# Patient Record
Sex: Female | Born: 1985
Health system: Southern US, Community
[De-identification: ages and names within clinical notes are randomized; demographics above are authoritative.]

## PROBLEM LIST (undated history)

## (undated) ENCOUNTER — Emergency Department (HOSPITAL_COMMUNITY): Payer: Commercial Managed Care - PPO

## (undated) DIAGNOSIS — O139 Gestational [pregnancy-induced] hypertension without significant proteinuria, unspecified trimester: Secondary | ICD-10-CM

## (undated) DIAGNOSIS — Z789 Other specified health status: Secondary | ICD-10-CM

## (undated) DIAGNOSIS — O24419 Gestational diabetes mellitus in pregnancy, unspecified control: Secondary | ICD-10-CM

## (undated) HISTORY — PX: WISDOM TOOTH EXTRACTION: SHX21

## (undated) HISTORY — PX: NO PAST SURGERIES: SHX2092

---

## 2005-02-18 ENCOUNTER — Other Ambulatory Visit: Admission: RE | Admit: 2005-02-18 | Discharge: 2005-02-18 | Payer: Self-pay | Admitting: Obstetrics & Gynecology

## 2011-12-30 ENCOUNTER — Other Ambulatory Visit (HOSPITAL_COMMUNITY): Payer: Self-pay | Admitting: Obstetrics & Gynecology

## 2011-12-30 DIAGNOSIS — N979 Female infertility, unspecified: Secondary | ICD-10-CM

## 2012-01-05 ENCOUNTER — Ambulatory Visit (HOSPITAL_COMMUNITY): Payer: 59

## 2012-11-22 ENCOUNTER — Other Ambulatory Visit (HOSPITAL_COMMUNITY): Payer: Self-pay | Admitting: Obstetrics & Gynecology

## 2012-11-22 DIAGNOSIS — N979 Female infertility, unspecified: Secondary | ICD-10-CM

## 2012-11-26 ENCOUNTER — Ambulatory Visit (HOSPITAL_COMMUNITY)
Admission: RE | Admit: 2012-11-26 | Discharge: 2012-11-26 | Disposition: A | Discharge status: Home / Self Care | Payer: 59 | Source: Ambulatory Visit | Attending: Obstetrics & Gynecology | Admitting: Obstetrics & Gynecology

## 2012-11-26 DIAGNOSIS — N979 Female infertility, unspecified: Secondary | ICD-10-CM | POA: Insufficient documentation

## 2012-11-26 MED ORDER — IOHEXOL 300 MG/ML  SOLN
20.0000 mL | Freq: Once | INTRAMUSCULAR | Status: AC | PRN
  Administered 2012-11-26: 20 mL

## 2015-08-23 ENCOUNTER — Telehealth: Payer: 59 | Admitting: Physician Assistant

## 2015-08-23 DIAGNOSIS — B349 Viral infection, unspecified: Secondary | ICD-10-CM | POA: Diagnosis not present

## 2015-08-23 DIAGNOSIS — J329 Chronic sinusitis, unspecified: Secondary | ICD-10-CM | POA: Diagnosis not present

## 2015-08-23 DIAGNOSIS — B9789 Other viral agents as the cause of diseases classified elsewhere: Secondary | ICD-10-CM

## 2015-08-23 MED ORDER — AZELASTINE HCL 0.1 % NA SOLN: 2.0000 | Freq: Two times a day (BID) | NASAL | Status: DC

## 2015-08-23 NOTE — Progress Notes (Signed)

## 2015-08-27 ENCOUNTER — Telehealth: Payer: 59 | Admitting: Nurse Practitioner

## 2015-08-27 DIAGNOSIS — J0100 Acute maxillary sinusitis, unspecified: Secondary | ICD-10-CM | POA: Diagnosis not present

## 2015-08-27 MED ORDER — AZITHROMYCIN 250 MG PO TABS: ORAL_TABLET | ORAL | Status: DC

## 2015-08-27 NOTE — Progress Notes (Signed)

## 2015-10-04 DIAGNOSIS — H5213 Myopia, bilateral: Secondary | ICD-10-CM | POA: Diagnosis not present

## 2015-10-13 ENCOUNTER — Telehealth: Payer: 59 | Admitting: Physician Assistant

## 2015-10-13 DIAGNOSIS — R399 Unspecified symptoms and signs involving the genitourinary system: Secondary | ICD-10-CM

## 2015-10-13 MED ORDER — NITROFURANTOIN MONOHYD MACRO 100 MG PO CAPS: 100.0000 mg | ORAL_CAPSULE | Freq: Two times a day (BID) | ORAL | Status: DC

## 2015-10-13 NOTE — Progress Notes (Signed)

## 2015-10-14 MED ORDER — NITROFURANTOIN MONOHYD MACRO 100 MG PO CAPS: 100.0000 mg | ORAL_CAPSULE | Freq: Two times a day (BID) | ORAL | Status: DC

## 2015-10-14 NOTE — Addendum Note (Signed)
Addended by: Evelina Dun A on: 10/14/2015 12:40 PM   Modules accepted: Orders

## 2015-11-26 ENCOUNTER — Other Ambulatory Visit (HOSPITAL_COMMUNITY)
Admission: RE | Admit: 2015-11-26 | Discharge: 2015-11-26 | Disposition: A | Discharge status: Home / Self Care | Payer: 59 | Source: Ambulatory Visit | Attending: Obstetrics & Gynecology | Admitting: Obstetrics & Gynecology

## 2015-11-26 DIAGNOSIS — Z124 Encounter for screening for malignant neoplasm of cervix: Secondary | ICD-10-CM | POA: Diagnosis not present

## 2015-11-26 DIAGNOSIS — O26849 Uterine size-date discrepancy, unspecified trimester: Secondary | ICD-10-CM | POA: Diagnosis not present

## 2015-11-26 DIAGNOSIS — Z3A00 Weeks of gestation of pregnancy not specified: Secondary | ICD-10-CM | POA: Insufficient documentation

## 2015-11-26 DIAGNOSIS — N925 Other specified irregular menstruation: Secondary | ICD-10-CM | POA: Diagnosis not present

## 2015-11-26 DIAGNOSIS — Z3201 Encounter for pregnancy test, result positive: Secondary | ICD-10-CM | POA: Diagnosis not present

## 2015-11-26 LAB — HCG, QUANTITATIVE, PREGNANCY: hCG, Beta Chain, Quant, S: 5249 m[IU]/mL — ABNORMAL HIGH (ref <5)

## 2015-11-28 ENCOUNTER — Other Ambulatory Visit (HOSPITAL_COMMUNITY)
Admission: RE | Admit: 2015-11-28 | Discharge: 2015-11-28 | Disposition: A | Discharge status: Home / Self Care | Payer: 59 | Source: Ambulatory Visit | Attending: Obstetrics and Gynecology | Admitting: Obstetrics and Gynecology

## 2015-11-28 DIAGNOSIS — N925 Other specified irregular menstruation: Secondary | ICD-10-CM | POA: Insufficient documentation

## 2015-11-28 LAB — HCG, QUANTITATIVE, PREGNANCY: HCG, BETA CHAIN, QUANT, S: 5299 m[IU]/mL — AB (ref <5)

## 2015-12-03 DIAGNOSIS — O039 Complete or unspecified spontaneous abortion without complication: Secondary | ICD-10-CM | POA: Diagnosis not present

## 2016-05-07 DIAGNOSIS — N925 Other specified irregular menstruation: Secondary | ICD-10-CM | POA: Diagnosis not present

## 2016-05-09 DIAGNOSIS — N925 Other specified irregular menstruation: Secondary | ICD-10-CM | POA: Diagnosis not present

## 2016-05-15 DIAGNOSIS — O200 Threatened abortion: Secondary | ICD-10-CM | POA: Diagnosis not present

## 2016-06-19 DIAGNOSIS — N96 Recurrent pregnancy loss: Secondary | ICD-10-CM | POA: Diagnosis not present

## 2016-06-20 DIAGNOSIS — N96 Recurrent pregnancy loss: Secondary | ICD-10-CM | POA: Diagnosis not present

## 2016-07-05 DIAGNOSIS — N96 Recurrent pregnancy loss: Secondary | ICD-10-CM | POA: Diagnosis not present

## 2016-07-07 DIAGNOSIS — Z319 Encounter for procreative management, unspecified: Secondary | ICD-10-CM | POA: Diagnosis not present

## 2016-08-07 DIAGNOSIS — Z319 Encounter for procreative management, unspecified: Secondary | ICD-10-CM | POA: Diagnosis not present

## 2016-08-11 MED FILL — PROGESTERONE 200 MG CAPSULE: 200 | 30 days supply | Qty: 60 | Fill #0

## 2016-08-27 MED FILL — LETROZOLE 2.5 MG TABLET: 2.5 | 30 days supply | Qty: 10 | Fill #0

## 2016-09-24 MED FILL — LETROZOLE 2.5 MG TABLET: 2.5 | 30 days supply | Qty: 10 | Fill #1

## 2016-09-24 MED FILL — PROGESTERONE 200 MG CAPSULE: 200 | 30 days supply | Qty: 60 | Fill #1

## 2016-11-13 DIAGNOSIS — H5213 Myopia, bilateral: Secondary | ICD-10-CM | POA: Diagnosis not present

## 2016-11-21 MED FILL — LETROZOLE 2.5 MG TABLET: 2.5 | 5 days supply | Qty: 15 | Fill #0

## 2016-12-03 DIAGNOSIS — Z319 Encounter for procreative management, unspecified: Secondary | ICD-10-CM | POA: Diagnosis not present

## 2016-12-05 DIAGNOSIS — Z3189 Encounter for other procreative management: Secondary | ICD-10-CM | POA: Diagnosis not present

## 2016-12-19 DIAGNOSIS — Z3200 Encounter for pregnancy test, result unknown: Secondary | ICD-10-CM | POA: Diagnosis not present

## 2016-12-22 DIAGNOSIS — Z3201 Encounter for pregnancy test, result positive: Secondary | ICD-10-CM | POA: Diagnosis not present

## 2016-12-22 MED FILL — PROGESTERONE 200 MG CAPSULE: 200 | 30 days supply | Qty: 60 | Fill #2

## 2016-12-31 DIAGNOSIS — Z3200 Encounter for pregnancy test, result unknown: Secondary | ICD-10-CM | POA: Diagnosis not present

## 2017-01-14 DIAGNOSIS — O2621 Pregnancy care for patient with recurrent pregnancy loss, first trimester: Secondary | ICD-10-CM | POA: Diagnosis not present

## 2017-01-14 DIAGNOSIS — O021 Missed abortion: Secondary | ICD-10-CM | POA: Diagnosis not present

## 2017-01-15 DIAGNOSIS — O2621 Pregnancy care for patient with recurrent pregnancy loss, first trimester: Secondary | ICD-10-CM | POA: Diagnosis not present

## 2017-01-15 DIAGNOSIS — N96 Recurrent pregnancy loss: Secondary | ICD-10-CM | POA: Diagnosis not present

## 2017-01-15 DIAGNOSIS — O021 Missed abortion: Secondary | ICD-10-CM | POA: Diagnosis not present

## 2017-01-17 DIAGNOSIS — Z319 Encounter for procreative management, unspecified: Secondary | ICD-10-CM | POA: Diagnosis not present

## 2017-03-16 DIAGNOSIS — N96 Recurrent pregnancy loss: Secondary | ICD-10-CM | POA: Diagnosis not present

## 2017-03-16 DIAGNOSIS — N8500 Endometrial hyperplasia, unspecified: Secondary | ICD-10-CM | POA: Diagnosis not present

## 2017-03-16 DIAGNOSIS — Z113 Encounter for screening for infections with a predominantly sexual mode of transmission: Secondary | ICD-10-CM | POA: Diagnosis not present

## 2017-03-16 DIAGNOSIS — N926 Irregular menstruation, unspecified: Secondary | ICD-10-CM | POA: Diagnosis not present

## 2017-03-16 MED FILL — DOXYCYCLINE HYCLATE 100 MG: 100 | 5 days supply | Qty: 10 | Fill #0

## 2017-03-18 MED FILL — METHYLPREDNISOLONE 4 MG TAB: 4 | 4 days supply | Qty: 16 | Fill #0

## 2017-03-18 MED FILL — BD 3 ML SYRINGE 18GX1-1/2: 18G X 1-1/2 | 30 days supply | Qty: 60 | Fill #0

## 2017-03-18 MED FILL — PROGESTERONE OIL 50 MG/ML V: 50 | 30 days supply | Qty: 30 | Fill #0

## 2017-03-18 MED FILL — BD NEEDLES 22GX1.5: 22G X 1-1/2 | 30 days supply | Qty: 30 | Fill #0

## 2017-03-18 MED FILL — BD 3 ML SYRINGE 18GX1-1/2": 18G X 1-1/2 | 30 days supply | Qty: 60 | Fill #0

## 2017-03-18 MED FILL — BD NEEDLES 22GX1.5": 22G X 1-1/2 | 30 days supply | Qty: 30 | Fill #0

## 2017-03-18 MED FILL — BD NEEDLES 30GX0.5: 30G X 1/2" | 20 days supply | Qty: 20 | Fill #0

## 2017-03-18 MED FILL — BD NEEDLES 30GX0.5": 30G X 1/2" | 20 days supply | Qty: 20 | Fill #0

## 2017-03-18 MED FILL — CETROTIDE 0.25 MG KIT: 0.25 | 4 days supply | Qty: 4 | Fill #0

## 2017-03-18 MED FILL — ESTRADIOL 0.1 MG PATCH: 0.1 | 28 days supply | Qty: 8 | Fill #0

## 2017-03-18 MED FILL — ESTRADIOL 2 MG TABLET: 2 | 30 days supply | Qty: 60 | Fill #0

## 2017-03-18 MED FILL — GONAL-F 1,050 UNITS VIAL: 1050 | 3 days supply | Qty: 3 | Fill #0

## 2017-03-18 MED FILL — MENOPUR 75 UNIT VIAL: 75 | 10 days supply | Qty: 10 | Fill #0

## 2017-03-18 MED FILL — PREGNYL 10,000 UNITS VIAL: 10000 | 1 days supply | Qty: 1 | Fill #0

## 2017-03-20 MED FILL — DOXYCYCLINE HYCLATE 100 MG: 100 | 20 days supply | Qty: 40 | Fill #0

## 2017-04-02 DIAGNOSIS — Z3183 Encounter for assisted reproductive fertility procedure cycle: Secondary | ICD-10-CM | POA: Diagnosis not present

## 2017-04-13 DIAGNOSIS — Z3183 Encounter for assisted reproductive fertility procedure cycle: Secondary | ICD-10-CM | POA: Diagnosis not present

## 2017-04-17 DIAGNOSIS — Z3183 Encounter for assisted reproductive fertility procedure cycle: Secondary | ICD-10-CM | POA: Diagnosis not present

## 2017-04-20 DIAGNOSIS — Z3183 Encounter for assisted reproductive fertility procedure cycle: Secondary | ICD-10-CM | POA: Diagnosis not present

## 2017-04-24 DIAGNOSIS — Z3183 Encounter for assisted reproductive fertility procedure cycle: Secondary | ICD-10-CM | POA: Diagnosis not present

## 2017-04-24 MED FILL — OXYCODONE W/APAP 5/325 TAB: 5-325 | 2 days supply | Qty: 10 | Fill #0

## 2017-04-24 MED FILL — PROMETHAZINE 12.5 MG TABLET: 12.5 | 3 days supply | Qty: 10 | Fill #0

## 2017-04-27 DIAGNOSIS — N856 Intrauterine synechiae: Secondary | ICD-10-CM | POA: Diagnosis not present

## 2017-04-27 DIAGNOSIS — Z3183 Encounter for assisted reproductive fertility procedure cycle: Secondary | ICD-10-CM | POA: Diagnosis not present

## 2017-04-27 MED FILL — NORG-ETHIN ESTRA 0.25-0.035: 0.25-35 | 28 days supply | Qty: 28 | Fill #0

## 2017-04-27 MED FILL — DOXYCYCLINE HYC 100 MG TAB: 100 | 10 days supply | Qty: 20 | Fill #0

## 2017-04-27 MED FILL — ESTRADIOL 2 MG TABLET: 2 | 15 days supply | Qty: 60 | Fill #0

## 2017-04-27 MED FILL — MEDROXYPROGESTERONE 10 MG T: 10 | 5 days supply | Qty: 5 | Fill #0

## 2017-05-02 DIAGNOSIS — Z3183 Encounter for assisted reproductive fertility procedure cycle: Secondary | ICD-10-CM | POA: Diagnosis not present

## 2017-05-11 MED FILL — ESTRADIOL 2 MG TABLET: 2 | 15 days supply | Qty: 60 | Fill #0

## 2017-05-19 MED FILL — NORG-ETHIN ESTRA 0.25-0.035: 0.25-35 | 28 days supply | Qty: 28 | Fill #0

## 2017-06-08 DIAGNOSIS — Z3141 Encounter for fertility testing: Secondary | ICD-10-CM | POA: Diagnosis not present

## 2017-06-08 DIAGNOSIS — Z319 Encounter for procreative management, unspecified: Secondary | ICD-10-CM | POA: Diagnosis not present

## 2017-06-08 MED FILL — DOXYCYCLINE HYCLATE 100 MG: 100 | 5 days supply | Qty: 10 | Fill #0

## 2017-06-09 MED FILL — ESTRADIOL 0.1 MG PATCH: 0.1 | 28 days supply | Qty: 8 | Fill #0

## 2017-06-24 DIAGNOSIS — Z3183 Encounter for assisted reproductive fertility procedure cycle: Secondary | ICD-10-CM | POA: Diagnosis not present

## 2017-07-02 DIAGNOSIS — Z3183 Encounter for assisted reproductive fertility procedure cycle: Secondary | ICD-10-CM | POA: Diagnosis not present

## 2017-07-06 MED FILL — ESTRADIOL 2 MG TABLET: 2 | 30 days supply | Qty: 60 | Fill #1

## 2017-07-06 MED FILL — ESTRADIOL 0.1 MG PATCH: 0.1 | 28 days supply | Qty: 8 | Fill #1

## 2017-07-10 DIAGNOSIS — Z3200 Encounter for pregnancy test, result unknown: Secondary | ICD-10-CM | POA: Diagnosis not present

## 2017-07-13 DIAGNOSIS — Z3201 Encounter for pregnancy test, result positive: Secondary | ICD-10-CM | POA: Diagnosis not present

## 2017-07-20 MED FILL — BD NEEDLES 22GX1.5: 22G X 1-1/2 | 30 days supply | Qty: 30 | Fill #1

## 2017-07-20 MED FILL — PROGESTERONE OIL 50 MG/ML V: 50 | 30 days supply | Qty: 30 | Fill #1

## 2017-07-20 MED FILL — BD NEEDLES 22GX1.5": 22G X 1-1/2 | 30 days supply | Qty: 30 | Fill #1

## 2017-07-27 DIAGNOSIS — Z3200 Encounter for pregnancy test, result unknown: Secondary | ICD-10-CM | POA: Diagnosis not present

## 2017-08-04 DIAGNOSIS — O200 Threatened abortion: Secondary | ICD-10-CM | POA: Diagnosis not present

## 2017-08-05 MED FILL — ESTRADIOL 2 MG TABLET: 2 | 30 days supply | Qty: 60 | Fill #2

## 2017-08-05 MED FILL — ESTRADIOL 0.1 MG PATCH: 0.1 | 28 days supply | Qty: 8 | Fill #2

## 2017-08-05 MED FILL — BD 3 ML SYRINGE 18GX1-1/2": 18G X 1-1/2 | 11 days supply | Qty: 24 | Fill #1

## 2017-08-05 MED FILL — BD 3 ML SYRINGE 18GX1-1/2: 18G X 1-1/2 | 11 days supply | Qty: 24 | Fill #1

## 2017-08-14 DIAGNOSIS — O0900 Supervision of pregnancy with history of infertility, unspecified trimester: Secondary | ICD-10-CM | POA: Diagnosis not present

## 2017-08-18 MED FILL — PROGESTERONE 200 MG CAPSULE: 200 | 14 days supply | Qty: 14 | Fill #0

## 2017-09-04 DIAGNOSIS — O3680X9 Pregnancy with inconclusive fetal viability, other fetus: Secondary | ICD-10-CM | POA: Diagnosis not present

## 2017-09-04 DIAGNOSIS — O26849 Uterine size-date discrepancy, unspecified trimester: Secondary | ICD-10-CM | POA: Diagnosis not present

## 2017-09-04 DIAGNOSIS — Z113 Encounter for screening for infections with a predominantly sexual mode of transmission: Secondary | ICD-10-CM | POA: Diagnosis not present

## 2017-09-04 DIAGNOSIS — Z3480 Encounter for supervision of other normal pregnancy, unspecified trimester: Secondary | ICD-10-CM | POA: Diagnosis not present

## 2017-10-05 DIAGNOSIS — Z3482 Encounter for supervision of other normal pregnancy, second trimester: Secondary | ICD-10-CM | POA: Diagnosis not present

## 2017-10-06 ENCOUNTER — Encounter: Payer: Self-pay | Admitting: *Deleted

## 2017-10-06 ENCOUNTER — Other Ambulatory Visit: Payer: Self-pay | Admitting: *Deleted

## 2017-10-06 NOTE — Patient Outreach (Signed)
Retuned call to [place] on her mobile number after she left message in response to this RNCM original call. Completed needs assessment as a result of  her affirmative answer on the Babyscripts survey related to a family history of diabetes.  Haley Rodgers states her Dad has Type II diabetes and she has not had her glucose tolerance test for this pregnancy. Advised [name] this RNCM will send her a letter with services offered by [facility] CM and contact information for this RNCM  should she test positive for gestational diabetes so that she can receive services and supplies to assist her with gestational diabetes self management.  Barrington Ellison RN,CCM,CDE [place] Management Coordinator Office Phone [phone] Office Fax [phone]

## 2017-10-06 NOTE — Patient Outreach (Signed)
Received referral on 10/06/17 to reach out to this [place] member in regards to positive answer to Babyscripts survey question related to family history of diabetes. Left message on D[place]'s mobile number requesting return call. Barrington Ellison RN,CCM,CDE [place] Management Coordinator Office Phone [phone] Office Fax [phone]

## 2017-10-30 DIAGNOSIS — Z363 Encounter for antenatal screening for malformations: Secondary | ICD-10-CM | POA: Diagnosis not present

## 2017-11-04 MED FILL — HYDROCORTISONE 2.5% OINT: 2.5 | 15 days supply | Qty: 28 | Fill #0

## 2017-11-16 DIAGNOSIS — Z369 Encounter for antenatal screening, unspecified: Secondary | ICD-10-CM | POA: Diagnosis not present

## 2017-12-14 DIAGNOSIS — Z23 Encounter for immunization: Secondary | ICD-10-CM | POA: Diagnosis not present

## 2017-12-14 DIAGNOSIS — Z3480 Encounter for supervision of other normal pregnancy, unspecified trimester: Secondary | ICD-10-CM | POA: Diagnosis not present

## 2017-12-21 DIAGNOSIS — O99810 Abnormal glucose complicating pregnancy: Secondary | ICD-10-CM | POA: Diagnosis not present

## 2017-12-23 ENCOUNTER — Other Ambulatory Visit: Payer: Self-pay | Admitting: *Deleted

## 2017-12-23 DIAGNOSIS — O24419 Gestational diabetes mellitus in pregnancy, unspecified control: Secondary | ICD-10-CM | POA: Insufficient documentation

## 2017-12-23 DIAGNOSIS — O24410 Gestational diabetes mellitus in pregnancy, diet controlled: Secondary | ICD-10-CM

## 2017-12-23 MED FILL — FREESTYLE LITE TEST STRIP: 25 days supply | Qty: 100 | Fill #0

## 2017-12-23 MED FILL — FREESTYLE LANCETS: 25 days supply | Qty: 100 | Fill #0

## 2017-12-23 MED FILL — FREESTYLE LITE METER: 30 days supply | Qty: 1 | Fill #0

## 2017-12-23 NOTE — Patient Outreach (Signed)
[facility]) Care Management  12/23/2017  [name] 11/25/1985 [id number]   Spoke with Haley Rodgers's regarding follow up to results of glucose tolerance test and whether to offer her enrollment in the [place] [facility] Gestational  Diabetes Program. She was just diagnosed with gestational diabetes so will enroll her in the program later today. Barrington Ellison RN,CCM,CDE [place] Management Coordinator Office Phone [phone] Office Fax [phone]

## 2017-12-23 NOTE — Patient Outreach (Signed)
[place] [facility]) Care Management  12/23/2017  Shakeela Rabadan Maita May 26, 1986 [id number]   Objective: Brooklyne was diagnosed with gestational diabetes today and agrees to enroll in the [facility] [facility] gestational diabetes program.   Subjective: Haley Rodgers states her due date is 03/21/18 and this is her first baby. 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    [facility] CM Care Plan Problem One     Most Recent Value  Care Plan Problem One  Knowledge deficit related to new diagnosis of gestational diabetes  Role Documenting the Problem One  Care Management Coordinator  Care Plan for Problem One  Active  THN Long Term Goal   In the next 90 days, patient will demonstrate good understanding of gestational diabetes self management as evidenced by: attending the gestational diabetes class, adherence to a carbohydrate controlled meal plan, self monitoring of blood sugars as prescribed with greater than 90% of values meeting target, adherence with provider appointments, deliver a healthy baby with no maternal  or fetal complications related to gestational diabetes.   THN Long Term Goal Start Date  12/23/17  Interventions for Problem One Long Term Goal Reviewed [place] [facility] Gestational Diabetes Program guidelines and benefits, provided information packet with explanation of contents, ensured patient has been referred to the [facility] gestational diabetes class, instructed patient on how to obtain testing supplies at no cost, and to ask pharmacist for instructions on glucometer use if needed, reviewed the American Diabetes Association recommendations related to frequency  of glucose testing and targets, defined hypoglycemia, symptoms and  reviewed rule of 15s for treating hypoglycemia,  reviewed strategies to treat elevated glucose, reviewed  plate method and/or  basic carbohydrate counting, discussed effect of stress on blood sugar and reviewed coping strategies, reinforced the importance of keeping provider appointments and attending class(es), encouraged patient to contact this RNCM for questions or concerns related to gestational diabetes self-management     RNCM will fax note to patient's provider. RNCM will ensure at least monthly contact with patient until she delivers.   Barrington Ellison RN,CCM,CDE [place] Management Coordinator Office Phone [phone] Office Fax [phone]

## 2017-12-30 ENCOUNTER — Other Ambulatory Visit: Payer: Self-pay | Admitting: *Deleted

## 2017-12-30 NOTE — Patient Outreach (Signed)
[place]) Care Management  12/30/2017  Haley Rodgers [birth year] [id number]  Received the following reply email from [place] on 12/29/17 at 3:50 pm:   Marcie Bal,     I did get my testing supplies, thank you. All my readings have been within range except one. It was 169 one hour after eating.   Thanks, [name]     Will continue to ensure at least monthly contact with [name] until she delivers her baby per the guidelines of the gestational diabetes program.  Barrington Ellison RN,CCM,CDE [place] Management Coordinator Office Phone [phone] Office Fax [phone]

## 2018-01-06 ENCOUNTER — Encounter: Payer: 59 | Attending: Obstetrics | Admitting: Registered"

## 2018-01-06 DIAGNOSIS — O24419 Gestational diabetes mellitus in pregnancy, unspecified control: Secondary | ICD-10-CM

## 2018-01-11 ENCOUNTER — Encounter: Payer: Self-pay | Admitting: Registered"

## 2018-01-11 NOTE — Progress Notes (Signed)
Patient was seen on 01/06/2018 for Gestational Diabetes self-management class [facility]. The following learning objectives were met by the patient during this course:   States the definition of Gestational Diabetes  States why dietary management is important in controlling blood glucose  Describes the effects each nutrient has on blood glucose levels  Demonstrates ability to create a balanced meal plan  Demonstrates carbohydrate counting   States when to check blood glucose levels  Demonstrates proper blood glucose monitoring techniques  States the effect of stress and exercise on blood glucose levels  States the importance of limiting caffeine and abstaining from alcohol and smoking  Blood glucose monitor given: none ([employer] Employee, has freestyle meter) Blood glucose reading: 91  Patient instructed to monitor glucose levels: FBS: 60 - <95; 1 hour: <140; 2 hour: <120  Patient received handouts:  Nutrition Diabetes and Pregnancy, including carb counting list  Patient will be seen for follow-up as needed.

## 2018-01-13 ENCOUNTER — Other Ambulatory Visit: Payer: Self-pay | Admitting: *Deleted

## 2018-01-13 NOTE — Patient Outreach (Signed)
[facility]) Care Management  01/13/2018  Haley Rodgers [birth year] [id number]   Haley Rodgers was enrolled in the [facility] Gestational Diabetes Program on 12/23/17. Her due date is 03/21/18.. E-mail sent to [facility] requesting update on blood sugars, timing of next OB MD appointment and for feedback on gestational diabetes class she attended on 01/06/18. Also reminded her to contact this RNCM for any gestational diabetes self management questions. Await response from [place].  Barrington Ellison RN,CCM,CDE [place] Management Coordinator Office Phone [phone] Office Fax [phone]

## 2018-01-13 NOTE — Patient Outreach (Signed)
[facility]) Care Management  01/13/2018  Haley Rodgers [place] 1986-06-30 [id number]   Received the following return email from [place] in follow  to a request for update on status of gestational diabetes management.   The gestational class was ok. My blood sugars are doing ok, my fasting levels are better. My last OB visit was Friday and she said that the levels were good and I didn't need meds but for me to keep checking my sugars. My next appointment is May 28th.   Thanks, [name] e-mail reply to [place] thanking her for update.  Will ensure monthly contact with [name] to provide assistance with gestational diabetes self- management.   Barrington Ellison RN,CCM,CDE [place] Management Coordinator Office Phone [phone] Office Fax [phone]

## 2018-01-14 MED FILL — FREESTYLE LITE TEST STRIP: 25 days supply | Qty: 100 | Fill #1

## 2018-02-04 MED FILL — metFORMIN HCL 500 MG TABS: 500 | 30 days supply | Qty: 60 | Fill #0

## 2018-02-08 MED FILL — FREESTYLE LITE TEST STRIP: 25 days supply | Qty: 100 | Fill #2

## 2018-02-12 DIAGNOSIS — O24410 Gestational diabetes mellitus in pregnancy, diet controlled: Secondary | ICD-10-CM | POA: Diagnosis not present

## 2018-02-15 ENCOUNTER — Other Ambulatory Visit: Payer: Self-pay | Admitting: *Deleted

## 2018-02-15 NOTE — Patient Outreach (Signed)
[facility]) Care Management  02/15/2018  Haley Rodgers 04/15/1986 [id number]   Secure e-mail to patient requesting clinical update and on her self management of gestational diabetes as she is in the [place] Gestational Diabetes Program.  Await response from [place].  Barrington Ellison RN,CCM,CDE [place] Management Coordinator Office Phone [phone] Office Fax [phone]

## 2018-02-15 NOTE — Patient Outreach (Signed)
[place] [facility]) Care Management  02/15/2018  Haley Rodgers 1986-02-04 [id number]   Received the following e-mail in response to earlier e-mail sent today to [facility]:  My sugars have been up and down. Last week they wanted to put me on metformin so I tried it for a few days and it made my sugars drop to low, so they have taken me off the meds and are just monitoring them again.  Thanks for checking in.  [name] return e-mail to [place] asking her for fasting and post prandial blood sugar averages and estimated due date. Await return e-mail.  Barrington Ellison RN,CCM,CDE [place] Management Coordinator Office Phone [phone] Office Fax [phone]

## 2018-02-17 ENCOUNTER — Other Ambulatory Visit: Payer: Self-pay | Admitting: *Deleted

## 2018-02-17 NOTE — Patient Outreach (Signed)
[facility]) Care Management  02/17/2018  Haley Rodgers [birth year] [id number]   Received the following reply e-mail from [name] in regards to her gestational diabetes self management:  My fasting blood sugars are usually between 80-90. My post meal readings vary anywhere from low 100's - 160's. What I eat isn't really making the difference but I have still been watching what I eat. Due to my sugars and starting the medication my doctor told me she wouldn't let me go to 40 weeks, so I have an appointment on Friday to measure his weight and then we will see when she will induce me.   [name]      Return e-mail sent to [employer] her for the additional information and requesting ongoing updates until she delivers.  Barrington Ellison RN,CCM,CDE [place] Management Coordinator Office Phone [phone] Office Fax [phone].

## 2018-02-19 DIAGNOSIS — O24419 Gestational diabetes mellitus in pregnancy, unspecified control: Secondary | ICD-10-CM | POA: Diagnosis not present

## 2018-02-19 DIAGNOSIS — Z369 Encounter for antenatal screening, unspecified: Secondary | ICD-10-CM | POA: Diagnosis not present

## 2018-02-19 DIAGNOSIS — Z3480 Encounter for supervision of other normal pregnancy, unspecified trimester: Secondary | ICD-10-CM | POA: Diagnosis not present

## 2018-02-24 ENCOUNTER — Inpatient Hospital Stay (HOSPITAL_COMMUNITY)
Admission: AD | Admit: 2018-02-24 | Discharge: 2018-02-24 | Disposition: A | Discharge status: Home / Self Care | Payer: 59 | Source: Ambulatory Visit | Attending: Obstetrics | Admitting: Obstetrics

## 2018-02-24 ENCOUNTER — Encounter (HOSPITAL_COMMUNITY): Payer: Self-pay | Admitting: *Deleted

## 2018-02-24 DIAGNOSIS — Z3A36 36 weeks gestation of pregnancy: Secondary | ICD-10-CM | POA: Insufficient documentation

## 2018-02-24 DIAGNOSIS — Z79899 Other long term (current) drug therapy: Secondary | ICD-10-CM | POA: Diagnosis not present

## 2018-02-24 DIAGNOSIS — O26893 Other specified pregnancy related conditions, third trimester: Secondary | ICD-10-CM | POA: Insufficient documentation

## 2018-02-24 DIAGNOSIS — R030 Elevated blood-pressure reading, without diagnosis of hypertension: Secondary | ICD-10-CM | POA: Insufficient documentation

## 2018-02-24 DIAGNOSIS — O163 Unspecified maternal hypertension, third trimester: Secondary | ICD-10-CM | POA: Diagnosis not present

## 2018-02-24 DIAGNOSIS — O99283 Endocrine, nutritional and metabolic diseases complicating pregnancy, third trimester: Secondary | ICD-10-CM | POA: Diagnosis not present

## 2018-02-24 DIAGNOSIS — O368130 Decreased fetal movements, third trimester, not applicable or unspecified: Secondary | ICD-10-CM | POA: Insufficient documentation

## 2018-02-24 DIAGNOSIS — E876 Hypokalemia: Secondary | ICD-10-CM | POA: Diagnosis not present

## 2018-02-24 HISTORY — DX: Other specified health status: Z78.9

## 2018-02-24 HISTORY — DX: Gestational diabetes mellitus in pregnancy, unspecified control: O24.419

## 2018-02-24 LAB — COMPREHENSIVE METABOLIC PANEL
ALT: 30 U/L (ref 0–44)
AST: 26 U/L (ref 15–41)
Albumin: 3.1 g/dL — ABNORMAL LOW (ref 3.5–5.0)
Alkaline Phosphatase: 154 U/L — ABNORMAL HIGH (ref 38–126)
Anion gap: 10 (ref 5–15)
BILIRUBIN TOTAL: 0.6 mg/dL (ref 0.3–1.2)
BUN: <5 mg/dL — ABNORMAL LOW (ref 6–20)
CHLORIDE: 105 mmol/L (ref 98–111)
CO2: 21 mmol/L — ABNORMAL LOW (ref 22–32)
Calcium: 8.7 mg/dL — ABNORMAL LOW (ref 8.9–10.3)
Creatinine, Ser: 0.41 mg/dL — ABNORMAL LOW (ref 0.44–1.00)
GFR calc non Af Amer: >60 mL/min (ref >60)
Glucose, Bld: 82 mg/dL (ref 70–99)
POTASSIUM: 2.7 mmol/L — AB (ref 3.5–5.1)
Sodium: 136 mmol/L (ref 135–145)
TOTAL PROTEIN: 6.7 g/dL (ref 6.5–8.1)

## 2018-02-24 LAB — PROTEIN / CREATININE RATIO, URINE
Creatinine, Urine: 30 mg/dL
Protein Creatinine Ratio: 0.27 mg/mg{Cre} — ABNORMAL HIGH (ref 0.00–0.15)
TOTAL PROTEIN, URINE: 8 mg/dL

## 2018-02-24 LAB — CBC
HEMATOCRIT: 31.6 % — AB (ref 36.0–46.0)
Hemoglobin: 10.4 g/dL — ABNORMAL LOW (ref 12.0–15.0)
MCH: 27.2 pg (ref 26.0–34.0)
MCHC: 32.9 g/dL (ref 30.0–36.0)
MCV: 82.7 fL (ref 78.0–100.0)
PLATELETS: 148 10*3/uL — AB (ref 150–400)
RBC: 3.82 MIL/uL — ABNORMAL LOW (ref 3.87–5.11)
RDW: 15.4 % (ref 11.5–15.5)
WBC: 6.4 10*3/uL (ref 4.0–10.5)

## 2018-02-24 LAB — URINALYSIS, ROUTINE W REFLEX MICROSCOPIC
BILIRUBIN URINE: NEGATIVE
GLUCOSE, UA: NEGATIVE mg/dL
HGB URINE DIPSTICK: NEGATIVE
Ketones, ur: NEGATIVE mg/dL
NITRITE: NEGATIVE
PH: 8 (ref 5.0–8.0)
Protein, ur: NEGATIVE mg/dL
Specific Gravity, Urine: 1.003 — ABNORMAL LOW (ref 1.005–1.030)

## 2018-02-24 MED ORDER — POTASSIUM CHLORIDE ER 10 MEQ PO TBCR: 40.0000 meq | EXTENDED_RELEASE_TABLET | Freq: Two times a day (BID) | ORAL | 0 refills | Status: DC

## 2018-02-24 MED FILL — POTASSIUM CL 10 MEQ TAB SA: 10 | 3 days supply | Qty: 24 | Fill #0

## 2018-02-24 NOTE — Discharge Instructions (Signed)
Preeclampsia and Eclampsia °Preeclampsia is a serious condition that develops only during pregnancy. It is also called toxemia of pregnancy. This condition causes high blood pressure along with other symptoms, such as swelling and headaches. These symptoms may develop as the condition gets worse. Preeclampsia may occur at 20 weeks of pregnancy or later. °Diagnosing and treating preeclampsia early is very important. If not treated early, it can cause serious problems for you and your baby. One problem it can lead to is eclampsia, which is a condition that causes muscle jerking or shaking (convulsions or seizures) in the mother. Delivering your baby is the best treatment for preeclampsia or eclampsia. Preeclampsia and eclampsia symptoms usually go away after your baby is born. °What are the causes? °The cause of preeclampsia is not known. °What increases the risk? °The following risk factors make you more likely to develop preeclampsia: °· Being pregnant for the first time. °· Having had preeclampsia during a past pregnancy. °· Having a family history of preeclampsia. °· Having high blood pressure. °· Being pregnant with twins or triplets. °· Being 35 or older. °· Being African-American. °· Having kidney disease or diabetes. °· Having medical conditions such as lupus or blood diseases. °· Being very overweight (obese). ° °What are the signs or symptoms? °The earliest signs of preeclampsia are: °· High blood pressure. °· Increased protein in your urine. Your health care provider will check for this at every visit before you give birth (prenatal visit). ° °Other symptoms that may develop as the condition gets worse include: °· Severe headaches. °· Sudden weight gain. °· Swelling of the hands, face, legs, and feet. °· Nausea and vomiting. °· Vision problems, such as blurred or double vision. °· Numbness in the face, arms, legs, and feet. °· Urinating less than usual. °· Dizziness. °· Slurred speech. °· Abdominal pain,  especially upper abdominal pain. °· Convulsions or seizures. ° °Symptoms generally go away after giving birth. °How is this diagnosed? °There are no screening tests for preeclampsia. Your health care provider will ask you about symptoms and check for signs of preeclampsia during your prenatal visits. You may also have tests that include: °· Urine tests. °· Blood tests. °· Checking your blood pressure. °· Monitoring your baby’s heart rate. °· Ultrasound. ° °How is this treated? °You and your health care provider will determine the treatment approach that is best for you. Treatment may include: °· Having more frequent prenatal exams to check for signs of preeclampsia, if you have an increased risk for preeclampsia. °· Bed rest. °· Reducing how much salt (sodium) you eat. °· Medicine to lower your blood pressure. °· Staying in the hospital, if your condition is severe. There, treatment will focus on controlling your blood pressure and the amount of fluids in your body (fluid retention). °· You may need to take medicine (magnesium sulfate) to prevent seizures. This medicine may be given as an injection or through an IV tube. °· Delivering your baby early, if your condition gets worse. You may have your labor started with medicine (induced), or you may have a cesarean delivery. ° °Follow these instructions at home: °Eating and drinking ° °· Drink enough fluid to keep your urine clear or pale yellow. °· Eat a healthy diet that is low in sodium. Do not add salt to your food. Check nutrition labels to see how much sodium a food or beverage contains. °· Avoid caffeine. °Lifestyle °· Do not use any products that contain nicotine or tobacco, such as cigarettes   and e-cigarettes. If you need help quitting, ask your health care provider. °· Do not use alcohol or drugs. °· Avoid stress as much as possible. Rest and get plenty of sleep. °General instructions °· Take over-the-counter and prescription medicines only as told by your  health care provider. °· When lying down, lie on your side. This keeps pressure off of your baby. °· When sitting or lying down, raise (elevate) your feet. Try putting some pillows underneath your lower legs. °· Exercise regularly. Ask your health care provider what kinds of exercise are best for you. °· Keep all follow-up and prenatal visits as told by your health care provider. This is important. °How is this prevented? °To prevent preeclampsia or eclampsia from developing during another pregnancy: °· Get proper medical care during pregnancy. Your health care provider may be able to prevent preeclampsia or diagnose and treat it early. °· Your health care provider may have you take a low-dose aspirin or a calcium supplement during your next pregnancy. °· You may have tests of your blood pressure and kidney function after giving birth. °· Maintain a healthy weight. Ask your health care provider for help managing weight gain during pregnancy. °· Work with your health care provider to manage any long-term (chronic) health conditions you have, such as diabetes or kidney problems. ° °Contact a health care provider if: °· You gain more weight than expected. °· You have headaches. °· You have nausea or vomiting. °· You have abdominal pain. °· You feel dizzy or light-headed. °Get help right away if: °· You develop sudden or severe swelling anywhere in your body. This usually happens in the legs. °· You gain 5 lbs (2.3 kg) or more during one week. °· You have severe: °? Abdominal pain. °? Headaches. °? Dizziness. °? Vision problems. °? Confusion. °? Nausea or vomiting. °· You have a seizure. °· You have trouble moving any part of your body. °· You develop numbness in any part of your body. °· You have trouble speaking. °· You have any abnormal bleeding. °· You pass out. °This information is not intended to replace advice given to you by your health care provider. Make sure you discuss any questions you have with your health  care provider. °Document Released: 08/15/2000 Document Revised: 04/15/2016 Document Reviewed: 03/24/2016 °Elsevier Interactive Patient Education © 2018 Elsevier Inc. ° °

## 2018-02-24 NOTE — MAU Note (Signed)
CRITICAL VALUE ALERT  Critical Value:  Potassium 2.3  Date & Time Notied:  02/24/18  Provider Notified: Maryelizabeth Kaufmann   Orders Received/Actions taken: provider notified

## 2018-02-24 NOTE — MAU Note (Signed)
Pt presents to MAU with complaints of a decrease in fetal movement. Felt baby move last night Denies any vaginal bleeding or LOF

## 2018-02-24 NOTE — MAU Provider Note (Signed)
History     CSN: [id number]  Arrival date and time: 02/24/18 6967   None     Chief Complaint  Patient presents with  . Decreased Fetal Movement   HPI  Haley Rodgers is a 32 y.o. G4P0030 at [redacted]w[redacted]d who presents to MAU with report of decreased fetal movement. This is a new problem. States she felt normal fetal movement last night but has not felt baby move this morning. Denies vaginal bleeding, leaking of fluid, headache, fever, falls, or recent illness.    Elevated blood pressure This is a new problem. 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 Denies headache, blurry vision, RUQ pain.  OB History    Gravida  4   Para      Term      Preterm      AB  3   Living  0     SAB  3   TAB      Ectopic      Multiple      Live Births              Past Medical History:  Diagnosis Date  . Gestational diabetes   . Medical history non-contributory     Past Surgical History:  Procedure Laterality Date  . NO PAST SURGERIES      History reviewed. No pertinent family history.  Social History   Tobacco Use  . Smoking status: Never Smoker  . Smokeless tobacco: Never Used  Substance Use Topics  . Alcohol use: Not Currently  . Drug use: Never    Allergies: No Known Allergies  Medications Prior to Admission  Medication Sig Dispense Refill Last Dose  . Prenatal Vit-Fe Fumarate-FA (PRENATAL MULTIVITAMIN) TABS tablet Take 1 tablet by mouth daily at 12 noon.     Marland Kitchen azelastine (ASTELIN) 0.1 % nasal spray Place 2 sprays into both nostrils 2 (two) times daily. Use in each nostril as directed 30 mL 12 Unknown at Unknown time  . nitrofurantoin, macrocrystal-monohydrate, (MACROBID) 100 MG capsule Take 1 capsule (100 mg total) by mouth 2 (two) times daily. 10 capsule 0 Unknown at Unknown time    Review of Systems  Gastrointestinal: Negative for abdominal pain, nausea and vomiting.  Endocrine:  Negative for polydipsia, polyphagia and polyuria.  Genitourinary: Negative for difficulty urinating, vaginal bleeding, vaginal discharge and vaginal pain.  Neurological: Negative for weakness, light-headedness and headaches.  All other systems reviewed and are negative.  Physical Exam   Blood pressure (!) 140/93, pulse (!) 101, temperature 98.7 F (37.1 C), resp. rate 16, height 5\' 4"  (1.626 m), weight 177 lb (80.3 kg), last menstrual period 06/14/2017.  Physical Exam  Nursing note and vitals reviewed. Constitutional: She is oriented to person, place, and time. She appears well-developed and well-nourished.  Cardiovascular: Normal rate, regular rhythm, normal heart sounds and intact distal pulses.  Respiratory: Effort normal and breath sounds normal.  GI:  Gravid  Genitourinary: Vagina normal and uterus normal.  Musculoskeletal: Normal range of motion.  Neurological: She is alert and oriented to person, place, and time. She has normal reflexes.  Skin: Skin is warm, dry and intact.  Psychiatric: She has a normal mood and affect. Her behavior is normal. Judgment and thought content normal.   +2 bilateral non-pitting swelling noted on feet and ankles.   MAU Course  Procedures  MDM Orders Placed This Encounter  Procedures  . Urinalysis,  Routine w reflex microscopic    Standing Status:   Standing    Number of Occurrences:   1  . CBC    Standing Status:   Standing    Number of Occurrences:   1  . Comprehensive metabolic panel    Standing Status:   Standing    Number of Occurrences:   1  . Protein / creatinine ratio, urine    Standing Status:   Standing    Number of Occurrences:   1   Results for orders placed or performed during the hospital encounter of 02/24/18 (from the past 24 hour(s))  CBC     Status: Abnormal   Collection Time: 02/24/18  9:53 AM  Result Value Ref Range   WBC 6.4 4.0 - 10.5 K/uL   RBC 3.82 (L) 3.87 - 5.11 MIL/uL   Hemoglobin 10.4 (L) 12.0 - 15.0 g/dL    HCT 31.6 (L) 36.0 - 46.0 %   MCV 82.7 78.0 - 100.0 fL   MCH 27.2 26.0 - 34.0 pg   MCHC 32.9 30.0 - 36.0 g/dL   RDW 15.4 11.5 - 15.5 %   Platelets 148 (L) 150 - 400 K/uL  Comprehensive metabolic panel     Status: Abnormal   Collection Time: 02/24/18  9:53 AM  Result Value Ref Range   Sodium 136 135 - 145 mmol/L   Potassium 2.7 (LL) 3.5 - 5.1 mmol/L   Chloride 105 98 - 111 mmol/L   CO2 21 (L) 22 - 32 mmol/L   Glucose, Bld 82 70 - 99 mg/dL   BUN <5 (L) 6 - 20 mg/dL   Creatinine, Ser 0.41 (L) 0.44 - 1.00 mg/dL   Calcium 8.7 (L) 8.9 - 10.3 mg/dL   Total Protein 6.7 6.5 - 8.1 g/dL   Albumin 3.1 (L) 3.5 - 5.0 g/dL   AST 26 15 - 41 U/L   ALT 30 0 - 44 U/L   Alkaline Phosphatase 154 (H) 38 - 126 U/L   Total Bilirubin 0.6 0.3 - 1.2 mg/dL   GFR calc non Af Amer >60 >60 mL/min   GFR calc Af Amer >60 >60 mL/min   Anion gap 10 5 - 15  Urinalysis, Routine w reflex microscopic     Status: Abnormal   Collection Time: 02/24/18  9:59 AM  Result Value Ref Range   Color, Urine YELLOW YELLOW   APPearance HAZY (A) CLEAR   Specific Gravity, Urine 1.003 (L) 1.005 - 1.030   pH 8.0 5.0 - 8.0   Glucose, UA NEGATIVE NEGATIVE mg/dL   Hgb urine dipstick NEGATIVE NEGATIVE   Bilirubin Urine NEGATIVE NEGATIVE   Ketones, ur NEGATIVE NEGATIVE mg/dL   Protein, ur NEGATIVE NEGATIVE mg/dL   Nitrite NEGATIVE NEGATIVE   Leukocytes, UA LARGE (A) NEGATIVE   RBC / HPF 0-5 0 - 5 RBC/hpf   WBC, UA 21-50 0 - 5 WBC/hpf   Bacteria, UA MANY (A) NONE SEEN   Squamous Epithelial / LPF 0-5 0 - 5   WBC Clumps PRESENT    Mucus PRESENT    Budding Yeast PRESENT   Protein / creatinine ratio, urine     Status: Abnormal   Collection Time: 02/24/18  9:59 AM  Result Value Ref Range   Creatinine, Urine 30.00 mg/dL   Total Protein, Urine 8 mg/dL   Protein Creatinine Ratio 0.27 (H) 0.00 - 0.15 mg/mg[Cre]    Vitals:   02/24/18 0945 02/24/18 1000 02/24/18 1005 02/24/18 1045  BP: (!) 131/91 Marland Kitchen)  132/96  (!) 136/92  Pulse: 83  87  75  Resp:      Temp:      SpO2: 98% 99% 98% 99%  Weight:      Height:        Reactive NST: Baseline 140, positive accelerations, no decelerations Toco: uterine irritability noted, not felt by patient Active fetal movement  Assessment and Plan  --32 y.o. G4P0030 at [redacted]w[redacted]d  --Elevated blood pressure in third trimester without severe range or severe symptoms --Hypokalemia --Reactive NST  Meds ordered this encounter  Medications  . potassium chloride (K-DUR) 10 MEQ tablet    Sig: Take 4 tablets (40 mEq total) by mouth 2 (two) times daily.    Dispense:  24 tablet    Refill:  0    Order Specific Question:   Supervising Provider    Answer:   Donnamae Jude [[id number]]   --Next ob visit tomorrow at 0830. Arrive early for NST --General obstetric and preeclampsia-related precautions discussed with patient and identified in AVS including but not limited to headache not relieved by Tylenol, blurry vision/floaters/ --HPI, MAU course of treatment, and plan of care discussed with and approved by Dr. Carlis Abbott --Discharge home in stable condition  Darlina Rumpf, CNM 02/24/2018, 11:07 AM

## 2018-02-25 DIAGNOSIS — R030 Elevated blood-pressure reading, without diagnosis of hypertension: Secondary | ICD-10-CM | POA: Diagnosis not present

## 2018-02-25 DIAGNOSIS — O24410 Gestational diabetes mellitus in pregnancy, diet controlled: Secondary | ICD-10-CM | POA: Diagnosis not present

## 2018-03-01 ENCOUNTER — Inpatient Hospital Stay (HOSPITAL_COMMUNITY): Payer: 59

## 2018-03-01 ENCOUNTER — Inpatient Hospital Stay (HOSPITAL_COMMUNITY)
Admission: AD | Admit: 2018-03-01 | Discharge: 2018-03-05 | DRG: 788 | Disposition: A | Discharge status: Home / Self Care | Payer: 59 | Attending: Obstetrics and Gynecology | Admitting: Obstetrics and Gynecology

## 2018-03-01 ENCOUNTER — Encounter (HOSPITAL_COMMUNITY)
Admission: AD | Disposition: A | Discharge status: Home / Self Care | Payer: Self-pay | Source: Home / Self Care | Attending: Obstetrics and Gynecology

## 2018-03-01 ENCOUNTER — Inpatient Hospital Stay (HOSPITAL_COMMUNITY): Payer: 59 | Admitting: Anesthesiology

## 2018-03-01 ENCOUNTER — Encounter (HOSPITAL_COMMUNITY): Payer: Self-pay | Admitting: *Deleted

## 2018-03-01 DIAGNOSIS — O133 Gestational [pregnancy-induced] hypertension without significant proteinuria, third trimester: Secondary | ICD-10-CM | POA: Diagnosis not present

## 2018-03-01 DIAGNOSIS — Z369 Encounter for antenatal screening, unspecified: Secondary | ICD-10-CM | POA: Diagnosis not present

## 2018-03-01 DIAGNOSIS — O134 Gestational [pregnancy-induced] hypertension without significant proteinuria, complicating childbirth: Secondary | ICD-10-CM | POA: Diagnosis present

## 2018-03-01 DIAGNOSIS — O368330 Maternal care for abnormalities of the fetal heart rate or rhythm, third trimester, not applicable or unspecified: Secondary | ICD-10-CM

## 2018-03-01 DIAGNOSIS — Z3490 Encounter for supervision of normal pregnancy, unspecified, unspecified trimester: Secondary | ICD-10-CM

## 2018-03-01 DIAGNOSIS — Z3A37 37 weeks gestation of pregnancy: Secondary | ICD-10-CM

## 2018-03-01 DIAGNOSIS — O24410 Gestational diabetes mellitus in pregnancy, diet controlled: Secondary | ICD-10-CM

## 2018-03-01 DIAGNOSIS — O24420 Gestational diabetes mellitus in childbirth, diet controlled: Secondary | ICD-10-CM | POA: Diagnosis not present

## 2018-03-01 DIAGNOSIS — D252 Subserosal leiomyoma of uterus: Secondary | ICD-10-CM | POA: Diagnosis present

## 2018-03-01 DIAGNOSIS — Z9889 Other specified postprocedural states: Secondary | ICD-10-CM

## 2018-03-01 DIAGNOSIS — O368390 Maternal care for abnormalities of the fetal heart rate or rhythm, unspecified trimester, not applicable or unspecified: Secondary | ICD-10-CM

## 2018-03-01 DIAGNOSIS — Z3A00 Weeks of gestation of pregnancy not specified: Secondary | ICD-10-CM | POA: Diagnosis not present

## 2018-03-01 DIAGNOSIS — O3413 Maternal care for benign tumor of corpus uteri, third trimester: Secondary | ICD-10-CM | POA: Diagnosis present

## 2018-03-01 LAB — CBC
HEMATOCRIT: 32.7 % — AB (ref 36.0–46.0)
HEMOGLOBIN: 10.9 g/dL — AB (ref 12.0–15.0)
MCH: 27.7 pg (ref 26.0–34.0)
MCHC: 33.3 g/dL (ref 30.0–36.0)
MCV: 83.2 fL (ref 78.0–100.0)
Platelets: 146 10*3/uL — ABNORMAL LOW (ref 150–400)
RBC: 3.93 MIL/uL (ref 3.87–5.11)
RDW: 15.5 % (ref 11.5–15.5)
WBC: 8.1 10*3/uL (ref 4.0–10.5)

## 2018-03-01 LAB — COMPREHENSIVE METABOLIC PANEL
ALBUMIN: 3.2 g/dL — AB (ref 3.5–5.0)
ALK PHOS: 185 U/L — AB (ref 38–126)
ALT: 25 U/L (ref 0–44)
ANION GAP: 11 (ref 5–15)
AST: 25 U/L (ref 15–41)
BILIRUBIN TOTAL: 0.7 mg/dL (ref 0.3–1.2)
BUN: 5 mg/dL — ABNORMAL LOW (ref 6–20)
CALCIUM: 9 mg/dL (ref 8.9–10.3)
CO2: 21 mmol/L — ABNORMAL LOW (ref 22–32)
Chloride: 104 mmol/L (ref 98–111)
Creatinine, Ser: 0.59 mg/dL (ref 0.44–1.00)
GFR calc Af Amer: >60 mL/min (ref >60)
GLUCOSE: 154 mg/dL — AB (ref 70–99)
Potassium: 3.6 mmol/L (ref 3.5–5.1)
Sodium: 136 mmol/L (ref 135–145)
TOTAL PROTEIN: 6.5 g/dL (ref 6.5–8.1)

## 2018-03-01 LAB — TYPE AND SCREEN
ABO/RH(D): O NEG
Antibody Screen: NEGATIVE
WEAK D: POSITIVE

## 2018-03-01 LAB — ABO/RH: ABO/RH(D): O NEG

## 2018-03-01 LAB — PROTEIN / CREATININE RATIO, URINE
CREATININE, URINE: 101 mg/dL
PROTEIN CREATININE RATIO: 0.24 mg/mg{creat} — AB (ref 0.00–0.15)
Total Protein, Urine: 24 mg/dL

## 2018-03-01 SURGERY — Surgical Case
Anesthesia: Spinal | Site: Abdomen | Wound class: Clean Contaminated

## 2018-03-01 MED ORDER — CEFAZOLIN SODIUM-DEXTROSE 2-4 GM/100ML-% IV SOLN
2.0000 g | INTRAVENOUS | Status: AC
  Administered 2018-03-01: 2 g via INTRAVENOUS
  Filled 2018-03-01: qty 100

## 2018-03-01 MED ORDER — TETANUS-DIPHTH-ACELL PERTUSSIS 5-2.5-18.5 LF-MCG/0.5 IM SUSP: 0.5000 mL | Freq: Once | INTRAMUSCULAR | Status: DC

## 2018-03-01 MED ORDER — MORPHINE SULFATE (PF) 4 MG/ML IV SOLN
1.0000 mg | INTRAVENOUS | Status: DC | PRN
Start: 2018-03-01 — End: 2018-03-01

## 2018-03-01 MED ORDER — OXYCODONE-ACETAMINOPHEN 5-325 MG PO TABS
2.0000 | ORAL_TABLET | ORAL | Status: DC | PRN
Start: 2018-03-01 — End: 2018-03-05

## 2018-03-01 MED ORDER — SIMETHICONE 80 MG PO CHEW
80.0000 mg | CHEWABLE_TABLET | ORAL | Status: DC
  Administered 2018-03-02 – 2018-03-05 (×3): 80 mg via ORAL
  Filled 2018-03-01 (×4): qty 1

## 2018-03-01 MED ORDER — ZOLPIDEM TARTRATE 5 MG PO TABS: 5.0000 mg | ORAL_TABLET | Freq: Every evening | ORAL | Status: DC | PRN

## 2018-03-01 MED ORDER — MORPHINE SULFATE (PF) 0.5 MG/ML IJ SOLN
INTRAMUSCULAR | Status: DC | PRN
  Administered 2018-03-01: .2 mg via INTRATHECAL

## 2018-03-01 MED ORDER — COCONUT OIL OIL: 1.0000 "application " | TOPICAL_OIL | Status: DC | PRN

## 2018-03-01 MED ORDER — FENTANYL CITRATE (PF) 100 MCG/2ML IJ SOLN
INTRAMUSCULAR | Status: DC | PRN
  Administered 2018-03-01: 10 ug via INTRATHECAL

## 2018-03-01 MED ORDER — OXYTOCIN 10 UNIT/ML IJ SOLN
INTRAVENOUS | Status: DC | PRN
  Administered 2018-03-01: 40 [IU] via INTRAVENOUS

## 2018-03-01 MED ORDER — KETOROLAC TROMETHAMINE 30 MG/ML IJ SOLN
30.0000 mg | Freq: Once | INTRAMUSCULAR | Status: AC | PRN
  Administered 2018-03-01: 30 mg via INTRAVENOUS

## 2018-03-01 MED ORDER — DEXAMETHASONE SODIUM PHOSPHATE 4 MG/ML IJ SOLN
INTRAMUSCULAR | Status: DC | PRN
  Administered 2018-03-01: 4 mg via INTRAVENOUS

## 2018-03-01 MED ORDER — ACETAMINOPHEN 325 MG PO TABS: 650.0000 mg | ORAL_TABLET | ORAL | Status: DC | PRN

## 2018-03-01 MED ORDER — NALBUPHINE HCL 10 MG/ML IJ SOLN: 5.0000 mg | INTRAMUSCULAR | Status: DC | PRN

## 2018-03-01 MED ORDER — WITCH HAZEL-GLYCERIN EX PADS: 1.0000 "application " | MEDICATED_PAD | CUTANEOUS | Status: DC | PRN

## 2018-03-01 MED ORDER — OXYTOCIN 10 UNIT/ML IJ SOLN
INTRAMUSCULAR | Status: AC
Start: 2018-03-01 — End: ?
  Filled 2018-03-01: qty 4

## 2018-03-01 MED ORDER — LACTATED RINGERS IV SOLN
INTRAVENOUS | Status: DC | PRN
  Administered 2018-03-01 (×3): via INTRAVENOUS

## 2018-03-01 MED ORDER — IBUPROFEN 600 MG PO TABS
600.0000 mg | ORAL_TABLET | Freq: Four times a day (QID) | ORAL | Status: DC
  Administered 2018-03-02 – 2018-03-05 (×13): 600 mg via ORAL
  Filled 2018-03-01 (×14): qty 1

## 2018-03-01 MED ORDER — ONDANSETRON HCL 4 MG/2ML IJ SOLN
INTRAMUSCULAR | Status: DC | PRN
  Administered 2018-03-01: 4 mg via INTRAVENOUS

## 2018-03-01 MED ORDER — LACTATED RINGERS IV SOLN
INTRAVENOUS | Status: DC
  Administered 2018-03-01: 22:00:00 via INTRAVENOUS

## 2018-03-01 MED ORDER — MEPERIDINE HCL 25 MG/ML IJ SOLN: 6.2500 mg | INTRAMUSCULAR | Status: DC | PRN

## 2018-03-01 MED ORDER — OXYTOCIN 40 UNITS IN LACTATED RINGERS INFUSION - SIMPLE MED: 2.5000 [IU]/h | INTRAVENOUS | Status: AC

## 2018-03-01 MED ORDER — FENTANYL CITRATE (PF) 100 MCG/2ML IJ SOLN
INTRAMUSCULAR | Status: AC
  Filled 2018-03-01: qty 2

## 2018-03-01 MED ORDER — DIBUCAINE 1 % RE OINT: 1.0000 "application " | TOPICAL_OINTMENT | RECTAL | Status: DC | PRN

## 2018-03-01 MED ORDER — LACTATED RINGERS IV BOLUS
500.0000 mL | Freq: Once | INTRAVENOUS | Status: AC
  Administered 2018-03-01: 1000 mL via INTRAVENOUS

## 2018-03-01 MED ORDER — PROMETHAZINE HCL 25 MG/ML IJ SOLN: 6.2500 mg | INTRAMUSCULAR | Status: DC | PRN

## 2018-03-01 MED ORDER — PHENYLEPHRINE 8 MG IN D5W 100 ML (0.08MG/ML) PREMIX OPTIME
INJECTION | INTRAVENOUS | Status: DC | PRN
  Administered 2018-03-01: 60 ug/min via INTRAVENOUS

## 2018-03-01 MED ORDER — PHENYLEPHRINE 8 MG IN D5W 100 ML (0.08MG/ML) PREMIX OPTIME
INJECTION | INTRAVENOUS | Status: AC
Start: 2018-03-01 — End: ?
  Filled 2018-03-01: qty 100

## 2018-03-01 MED ORDER — SCOPOLAMINE 1 MG/3DAYS TD PT72
1.0000 | MEDICATED_PATCH | TRANSDERMAL | Status: DC
  Administered 2018-03-01: 1.5 mg via TRANSDERMAL
  Filled 2018-03-01: qty 1

## 2018-03-01 MED ORDER — MIDAZOLAM HCL 2 MG/2ML IJ SOLN: 0.5000 mg | Freq: Once | INTRAMUSCULAR | Status: DC | PRN

## 2018-03-01 MED ORDER — ONDANSETRON HCL 4 MG/2ML IJ SOLN
INTRAMUSCULAR | Status: AC
  Filled 2018-03-01: qty 2

## 2018-03-01 MED ORDER — BUPIVACAINE IN DEXTROSE 0.75-8.25 % IT SOLN
INTRATHECAL | Status: DC | PRN
  Administered 2018-03-01: 10.5 mg via INTRATHECAL

## 2018-03-01 MED ORDER — FAMOTIDINE IN NACL 20-0.9 MG/50ML-% IV SOLN: 20.0000 mg | Freq: Once | INTRAVENOUS | Status: DC

## 2018-03-01 MED ORDER — LACTATED RINGERS IV SOLN
INTRAVENOUS | Status: DC | PRN
  Administered 2018-03-01: 19:00:00 via INTRAVENOUS

## 2018-03-01 MED ORDER — PRENATAL MULTIVITAMIN CH
1.0000 | ORAL_TABLET | Freq: Every day | ORAL | Status: DC
  Administered 2018-03-02 – 2018-03-05 (×4): 1 via ORAL
  Filled 2018-03-01 (×4): qty 1

## 2018-03-01 MED ORDER — SENNOSIDES-DOCUSATE SODIUM 8.6-50 MG PO TABS
2.0000 | ORAL_TABLET | ORAL | Status: DC
  Administered 2018-03-02 – 2018-03-04 (×2): 2 via ORAL
  Filled 2018-03-01 (×3): qty 2

## 2018-03-01 MED ORDER — KETOROLAC TROMETHAMINE 30 MG/ML IJ SOLN
INTRAMUSCULAR | Status: AC
  Filled 2018-03-01: qty 1

## 2018-03-01 MED ORDER — MEASLES, MUMPS & RUBELLA VAC ~~LOC~~ INJ
0.5000 mL | INJECTION | Freq: Once | SUBCUTANEOUS | Status: DC
  Filled 2018-03-01: qty 0.5

## 2018-03-01 MED ORDER — SOD CITRATE-CITRIC ACID 500-334 MG/5ML PO SOLN
30.0000 mL | Freq: Once | ORAL | Status: AC
  Administered 2018-03-01: 30 mL via ORAL
  Filled 2018-03-01: qty 15

## 2018-03-01 MED ORDER — MORPHINE SULFATE (PF) 0.5 MG/ML IJ SOLN
INTRAMUSCULAR | Status: AC
Start: 2018-03-01 — End: ?
  Filled 2018-03-01: qty 10

## 2018-03-01 MED ORDER — OXYCODONE-ACETAMINOPHEN 5-325 MG PO TABS: 1.0000 | ORAL_TABLET | ORAL | Status: DC | PRN

## 2018-03-01 SURGICAL SUPPLY — 30 items
APL SKNCLS STERI-STRIP NONHPOA (GAUZE/BANDAGES/DRESSINGS) ×1
BENZOIN TINCTURE PRP APPL 2/3 (GAUZE/BANDAGES/DRESSINGS) ×2 IMPLANT
CHLORAPREP W/TINT 26ML (MISCELLANEOUS) ×2 IMPLANT
CLAMP CORD UMBIL (MISCELLANEOUS) IMPLANT
CLOSURE STERI STRIP 1/2 X4 (GAUZE/BANDAGES/DRESSINGS) ×2 IMPLANT
CLOTH BEACON ORANGE TIMEOUT ST (SAFETY) ×2 IMPLANT
DRSG OPSITE POSTOP 4X10 (GAUZE/BANDAGES/DRESSINGS) ×2 IMPLANT
ELECT REM PT RETURN 9FT ADLT (ELECTROSURGICAL) ×2
ELECTRODE REM PT RTRN 9FT ADLT (ELECTROSURGICAL) ×1 IMPLANT
EXTRACTOR VACUUM M CUP 4 TUBE (SUCTIONS) ×2 IMPLANT
GLOVE BIOGEL PI IND STRL 7.0 (GLOVE) ×1 IMPLANT
GLOVE BIOGEL PI INDICATOR 7.0 (GLOVE) ×1
GLOVE ECLIPSE 7.0 STRL STRAW (GLOVE) ×4 IMPLANT
GOWN STRL REUS W/TWL LRG LVL3 (GOWN DISPOSABLE) ×4 IMPLANT
KIT ABG SYR 3ML LUER SLIP (SYRINGE) IMPLANT
NEEDLE HYPO 25X5/8 SAFETYGLIDE (NEEDLE) IMPLANT
NS IRRIG 1000ML POUR BTL (IV SOLUTION) ×2 IMPLANT
PACK C SECTION WH (CUSTOM PROCEDURE TRAY) ×2 IMPLANT
PAD OB MATERNITY 4.3X12.25 (PERSONAL CARE ITEMS) ×2 IMPLANT
RETRACTOR WND ALEXIS 25 LRG (MISCELLANEOUS) ×1 IMPLANT
RTRCTR WOUND ALEXIS 25CM LRG (MISCELLANEOUS) ×2
STRIP CLOSURE SKIN 1/2X4 (GAUZE/BANDAGES/DRESSINGS) ×2 IMPLANT
SUT MNCRL 0 VIOLET CTX 36 (SUTURE) ×3 IMPLANT
SUT MON AB 2-0 CT1 27 (SUTURE) ×4 IMPLANT
SUT MONOCRYL 0 CTX 36 (SUTURE) ×3
SUT PLAIN 0 NONE (SUTURE) IMPLANT
SUT PLAIN 2 0 XLH (SUTURE) ×2 IMPLANT
SUT VIC AB 4-0 KS 27 (SUTURE) ×2 IMPLANT
TOWEL OR 17X24 6PK STRL BLUE (TOWEL DISPOSABLE) ×2 IMPLANT
TRAY FOLEY W/BAG SLVR 14FR LF (SET/KITS/TRAYS/PACK) IMPLANT

## 2018-03-01 NOTE — H&P (Signed)
Haley Rodgers is an 31 y.o. G4P0030 108w1d white female who sent to the ER for an induction for PIH. When she presented to the ER the FHTs were in the 60's They abruptly returned the 120s. She was sent for an u/s with MFM to evaluate the fetus for a heart block. After evaluation the recommendation was a stat C/S. For a more complete hx see the MFM u/s recommendation. The pt has continued to have nl FM. The AFI is wnl. This is an IVF preg.  Chief Complaint: HPI:  Past Medical History:  Diagnosis Date  . Gestational diabetes   . Medical history non-contributory     Past Surgical History:  Procedure Laterality Date  . NO PAST SURGERIES      No family history on file. Social History:  reports that she has never smoked. She has never used smokeless tobacco. She reports that she drank alcohol. She reports that she does not use drugs.  Allergies: No Known Allergies  Medications Prior to Admission  Medication Sig Dispense Refill  . acetaminophen (TYLENOL) 325 MG tablet Take 650 mg by mouth every 6 (six) hours as needed for headache.    . calcium carbonate (TUMS - DOSED IN MG ELEMENTAL CALCIUM) 500 MG chewable tablet Chew 1 tablet by mouth 2 (two) times daily as needed for indigestion or heartburn.    . hydrocortisone 2.5 % ointment hydrocortisone 2.5 % topical ointment  APPLY A THIN LAYER TO THE AFFECTED AREA BY TOPICAL ROUTE 2 TIMES PER DAY    . Prenatal Vit-Fe Fumarate-FA (PRENATAL MULTIVITAMIN) TABS tablet Take 1 tablet by mouth daily at 12 noon.    . potassium chloride (K-DUR) 10 MEQ tablet Take 4 tablets (40 mEq total) by mouth 2 (two) times daily. 24 tablet 0       Blood pressure 120/75, pulse 95, temperature 98.6 F (37 C), resp. rate 18, height 5\' 4"  (1.626 m), weight 174 lb (78.9 kg), last menstrual period 06/14/2017, SpO2 96 %. General appearance: alert, cooperative and appears stated age Abdomen: gravid, nontender   Lab Results  Component Value Date   WBC 8.1 03/01/2018    HGB 10.9 (L) 03/01/2018   HCT 32.7 (L) 03/01/2018   MCV 83.2 03/01/2018   PLT 146 (L) 03/01/2018   No results found for: PREGTESTUR, PREGSERUM, HCG, HCGQUANT   Patient Active Problem List   Diagnosis Date Noted  . Gestational diabetes mellitus (GDM), antepartum 12/23/2017   IMP/ IUP at 37 weeks         Suspected fetal arrhythmia         IVF preg Plan/ To OR for c/s  Haley Rodgers E 03/01/2018, 5:52 PM

## 2018-03-01 NOTE — Anesthesia Preprocedure Evaluation (Signed)
Anesthesia Evaluation  Patient identified by MRN, date of birth, ID band Patient awake    Reviewed: Allergy & Precautions, NPO status , Patient's Chart, lab work & pertinent test results  History of Anesthesia Complications Negative for: history of anesthetic complications  Airway Mallampati: II  TM Distance: >3 FB Neck ROM: Full    Dental  (+) Dental Advisory Given   Pulmonary neg pulmonary ROS,    breath sounds clear to auscultation       Cardiovascular negative cardio ROS   Rhythm:Regular Rate:Normal     Neuro/Psych negative neurological ROS     GI/Hepatic Neg liver ROS, GERD  ,  Endo/Other  diabetes (diet controlled, glu 154), Gestational  Renal/GU negative Renal ROS     Musculoskeletal   Abdominal   Peds  Hematology plt 146k   Anesthesia Other Findings   Reproductive/Obstetrics (+) Pregnancy C-section for fetal dysrhythmia                             Anesthesia Physical Anesthesia Plan  ASA: II and emergent  Anesthesia Plan: Spinal   Post-op Pain Management:    Induction:   PONV Risk Score and Plan: 2 and Ondansetron, Dexamethasone, Scopolamine patch - Pre-op and Treatment may vary due to age or medical condition  Airway Management Planned: Natural Airway  Additional Equipment:   Intra-op Plan:   Post-operative Plan:   Informed Consent: I have reviewed the patients History and Physical, chart, labs and discussed the procedure including the risks, benefits and alternatives for the proposed anesthesia with the patient or authorized representative who has indicated his/her understanding and acceptance.   Dental advisory given  Plan Discussed with: CRNA and Surgeon  Anesthesia Plan Comments: (Plan routine monitors, SAB)        Anesthesia Quick Evaluation

## 2018-03-01 NOTE — Progress Notes (Signed)
Intermittent tracing, FHR baseline appears to be 140 with moderate variability, pt on L side, cordless monitor reapplied.

## 2018-03-01 NOTE — MAU Provider Note (Addendum)
History     CSN: [id number]  Arrival date and time: 03/01/18 1429  Chief Complaint  Patient presents with  . Hypertension   G4P0030 @37 .1 wks sent from office for IOL d/t gHTN. Denies HA, visual disturbances, and epigastric pain. Reports good FM.    OB History    Gravida  4   Para      Term      Preterm      AB  3   Living  0     SAB  3   TAB      Ectopic      Multiple      Live Births              Past Medical History:  Diagnosis Date  . Gestational diabetes   . Medical history non-contributory     Past Surgical History:  Procedure Laterality Date  . NO PAST SURGERIES      No family history on file.  Social History   Tobacco Use  . Smoking status: Never Smoker  . Smokeless tobacco: Never Used  Substance Use Topics  . Alcohol use: Not Currently  . Drug use: Never    Allergies: No Known Allergies  Medications Prior to Admission  Medication Sig Dispense Refill Last Dose  . acetaminophen (TYLENOL) 325 MG tablet Take 650 mg by mouth every 6 (six) hours as needed for headache.   Past Month at Unknown time  . potassium chloride (K-DUR) 10 MEQ tablet Take 4 tablets (40 mEq total) by mouth 2 (two) times daily. 24 tablet 0   . Prenatal Vit-Fe Fumarate-FA (PRENATAL MULTIVITAMIN) TABS tablet Take 1 tablet by mouth daily at 12 noon.   02/23/2018 at Unknown time    Review of Systems  Eyes: Negative for visual disturbance.  Cardiovascular: Negative for leg swelling.  Gastrointestinal: Negative for abdominal pain.  Genitourinary: Negative for vaginal bleeding.  Neurological: Negative for headaches.   Physical Exam   Blood pressure (!) 137/92, pulse (!) 105, temperature 98.6 F (37 C), resp. rate 18, height 5\' 4"  (1.626 m), weight 174 lb (78.9 kg), last menstrual period 06/14/2017.  Physical Exam  Constitutional: She is oriented to person, place, and time. She appears well-developed and well-nourished. No distress.  HENT:  Head: Normocephalic  and atraumatic.  Neck: Normal range of motion.  Respiratory: Effort normal. No respiratory distress.  Musculoskeletal: Normal range of motion.  Neurological: She is alert and oriented to person, place, and time.  Skin: Skin is warm and dry.  Psychiatric: She has a normal mood and affect.  EFM: indeterminent baseline, mod variability Toco: irregular  Results for orders placed or performed during the hospital encounter of 03/01/18 (from the past 24 hour(s))  CBC     Status: Abnormal   Collection Time: 03/01/18  3:07 PM  Result Value Ref Range   WBC 8.1 4.0 - 10.5 K/uL   RBC 3.93 3.87 - 5.11 MIL/uL   Hemoglobin 10.9 (L) 12.0 - 15.0 g/dL   HCT 32.7 (L) 36.0 - 46.0 %   MCV 83.2 78.0 - 100.0 fL   MCH 27.7 26.0 - 34.0 pg   MCHC 33.3 30.0 - 36.0 g/dL   RDW 15.5 11.5 - 15.5 %   Platelets 146 (L) 150 - 400 K/uL   MAU Course  Procedures  MDM Prenatal records reviewed. Pregnancy is complicated by IVF, gHTN and A1GDM.  1503:Called to bedside by RN d/t FHT. EFM shows FHT in 60s, audibly irregular, bedside US  shows possible irregular heart motion, FHR abruptly up to 130s and intermittent abruptly down into 60s, suspect arrhythmia.  1507: Dr. Ouida Sills notified of clinical findings and [place] ordered.  1611: [place] complete, awaiting stat read 1708: Consult with Dr. Donalee Citrin, recommend CS delivery.  1712: Discussed with Dr. Dennison Mascot cell phone number for him to discuss with Dr. Donalee Citrin. Plan for CS delivery. Care turned over to Dr. Ouida Sills.  Assessment and Plan   1. [redacted] weeks gestation of pregnancy   2. Fetal arrhythmia affecting pregnancy, antepartum   3. Abnormality in fetal heart rhythm before the onset of labor   4. Gestational hypertension, third trimester   5. GDM, class A1    Admit to OR Mngt per Dr. Kimberlee Nearing, CNM 03/01/2018, 3:15 PM

## 2018-03-01 NOTE — Progress Notes (Signed)
EFM tracing FHR in 60's, sounds like possible missed beats, cardio changed from cordless to one with cord.

## 2018-03-01 NOTE — MAU Note (Signed)
Pt sent from office for elevated b/p's 130's-140's/90's.  Denies any Headache or visual changes. Good fetal movement reported.

## 2018-03-01 NOTE — Progress Notes (Signed)
FHR in 60's with audible arrhythmia, intermittently tracing @ 155 with moderate variability.

## 2018-03-01 NOTE — Progress Notes (Signed)
FHR in the 60's for the last 4-5 minutes, audible arrythmia

## 2018-03-01 NOTE — Anesthesia Procedure Notes (Signed)
Spinal  Patient location during procedure: OR End time: 03/01/2018 6:12 PM Staffing Anesthesiologist: Annye Asa, MD Performed: anesthesiologist  Preanesthetic Checklist Completed: patient identified, surgical consent, pre-op evaluation, timeout performed, IV checked, risks and benefits discussed and monitors and equipment checked Spinal Block Patient position: sitting Prep: site prepped and draped and DuraPrep Patient monitoring: blood pressure, continuous pulse ox and heart rate Approach: midline Location: L3-4 Injection technique: single-shot Needle Needle type: Pencan  Needle gauge: 24 G Needle length: 9 cm Additional Notes Pt identified in Operating room.  Monitors applied. Working IV access confirmed. Sterile prep, drape lumbar spine.  1% lido local L 3,4.  #24ga Pencan into clear CSF L 3,4.  10.5 mg 0.75% Bupivacaine with dextrose, fentanyl, morphine injected with asp CSF beginning and end of injection.  Patient asymptomatic, VSS, no heme aspirated, tolerated well.  Jenita Seashore, MD

## 2018-03-01 NOTE — Progress Notes (Signed)
OBIX down, paper strip running.

## 2018-03-01 NOTE — Transfer of Care (Signed)
Immediate Anesthesia Transfer of Care Note  Patient: Haley Rodgers  Procedure(s) Performed: CESAREAN SECTION (N/A Abdomen)  Patient Location: PACU  Anesthesia Type:Spinal  Level of Consciousness: awake, alert  and oriented  Airway & Oxygen Therapy: Patient Spontanous Breathing  Post-op Assessment: Report given to RN and Post -op Vital signs reviewed and stable  Post vital signs: Reviewed and stable  Last Vitals:  Vitals Value Taken Time  BP    Temp    Pulse 91 03/01/2018  7:27 PM  Resp    SpO2 100 % 03/01/2018  7:27 PM  Vitals shown include unvalidated device data.  Last Pain:  Vitals:   03/01/18 1437  PainSc: 0-No pain         Complications: No apparent anesthesia complications

## 2018-03-02 ENCOUNTER — Encounter (HOSPITAL_COMMUNITY): Payer: Self-pay | Admitting: Obstetrics and Gynecology

## 2018-03-02 LAB — CBC
HEMATOCRIT: 27.3 % — AB (ref 36.0–46.0)
HEMOGLOBIN: 9.2 g/dL — AB (ref 12.0–15.0)
MCH: 28.3 pg (ref 26.0–34.0)
MCHC: 33.7 g/dL (ref 30.0–36.0)
MCV: 84 fL (ref 78.0–100.0)
Platelets: 126 10*3/uL — ABNORMAL LOW (ref 150–400)
RBC: 3.25 MIL/uL — AB (ref 3.87–5.11)
RDW: 15.4 % (ref 11.5–15.5)
WBC: 8.5 10*3/uL (ref 4.0–10.5)

## 2018-03-02 LAB — RPR: RPR: NONREACTIVE

## 2018-03-02 LAB — GLUCOSE, CAPILLARY: Glucose-Capillary: 77 mg/dL (ref 70–99)

## 2018-03-02 MED ORDER — LACTATED RINGERS IV BOLUS: 500.0000 mL | Freq: Once | INTRAVENOUS | Status: DC

## 2018-03-02 NOTE — Anesthesia Postprocedure Evaluation (Signed)
Anesthesia Post Note  Patient: Haley Rodgers  Procedure(s) Performed: CESAREAN SECTION (N/A Abdomen)     Patient location during evaluation: Mother Baby Anesthesia Type: Spinal Level of consciousness: awake Pain management: pain level controlled Vital Signs Assessment: post-procedure vital signs reviewed and stable Respiratory status: spontaneous breathing Cardiovascular status: stable Postop Assessment: no headache, no backache, spinal receding and patient able to bend at knees Anesthetic complications: no    Last Vitals:  Vitals:   03/02/18 0258 03/02/18 0606  BP: 112/88 90/62  Pulse: 83 (!) 55  Resp: 20 18  Temp:  36.8 C  SpO2:  94%    Last Pain:  Vitals:   03/02/18 0606  TempSrc: Oral  PainSc:    Pain Goal:                 Everette Rank

## 2018-03-02 NOTE — Op Note (Signed)
NAME: Haley Rodgers, Haley Rodgers MEDICAL RECORD [id number] ACCOUNT [id number] DATE OF BIRTH:1985-09-22 FACILITY: [facility] LOCATION: [id number] Wynetta Emery, MD  CONSULTATION  DATE OF ADMISSION:  03/01/2018  PREOPERATIVE DIAGNOSES:   1.  Intrauterine pregnancy at 1 weeks estimated gestational age. 2.  Fetal bradycardia, suspect heart block.  POSTOPERATIVE DIAGNOSES: 1.   Intrauterine pregnancy at 15 weeks estimated gestational age. 2.  Fetal bradycardia, suspect heart block.  PROCEDURE:  Primary low transverse cesarean section.  SURGEON:  Mark E. Ouida Sills, MD  ANESTHESIA:  Spinal.  ANTIBIOTICS:  Ancef 2 grams.  DRAINS:  Foley to bedside drainage.    ESTIMATED BLOOD LOSS:   900 mL.  SPECIMENS:  Placenta sent to pathology.  COMPLICATIONS:  None.  DESCRIPTION OF PROCEDURE:  The patient was taken to the operating room where a spinal anesthetic was administered without difficulty.  She was then placed in a dorsal supine position with a left lateral tilt.  Once an adequate level of anesthesia was  reached, she was prepped and draped in the usual fashion for this procedure.  A Pfannenstiel incision was made 2 cm above the pubic symphysis.  On entering the abdominal cavity, the bladder flap was taken down with sharp dissection.  A low transverse  uterine incision was made in the midline and extended laterally with blunt dissection.  The infant was delivered with the vacuum extractor.  There were 3 pop-offs.  Once the head was delivered.  The oropharynx and nostrils were bulb suctioned.  The  remaining infant was then delivered.  The cord was doubly clamped and cut and the infant handed to the awaiting NICU team.  The placenta was then manually removed.  The uterus was exteriorized.  Uterine cavity was examined and wiped with a clean lap.   The uterine incision was closed in a single layer of 0 Monocryl suture in a running locking fashion.  The bladder flap was closed using 2-0  Monocryl in a running fashion.  The uterus was placed back in the abdominal cavity.  Hemostasis was checked and  felt to be adequate.  Of note, the patient did have a 3-4 cm subserosal fibroid on the posterior right fundus.  Fallopian tubes, and ovaries were normal bilaterally.  The parietal peritoneum and rectus muscles were reapproximated in the midline with 2-0  Monocryl.  The fascia was closed in a running fashion using 0 Monocryl suture.  Subcuticular tissue was irrigated, made hemostatic with the Bovie and then closed with interrupted 2-0 plain gut suture and the skin was closed with 3-0 Vicryl in a  subcuticular fashion.  Instrument and lap count was correct x3.  AN/NUANCE D:03/01/2018 T:03/02/2018 JOB:[id number]

## 2018-03-02 NOTE — Progress Notes (Signed)
Subjective: Postpartum Day 1: Cesarean Delivery Patient evaluated in NICU at baby's bedside. Pain controlled at surgical site, pt reports some left shoulder pain. Breast feeding. Ambulating without difficulty, voiting  Objective: Vital signs in last 24 hours: Temp:  [97.3 F (36.3 C)-98.3 F (36.8 C)] 98 F (36.7 C) (07/02 1147) Pulse Rate:  [53-83] 66 (07/02 1147) Resp:  [15-20] 16 (07/02 1147) BP: (90-138)/(62-92) 109/79 (07/02 1147) SpO2:  [94 %-98 %] 98 % (07/02 1147)  Physical Exam:  General: alert, cooperative and appears stated age Lochia: unable to evaluate in NICU Uterine Fundus: firm Incision: unable to evaluate in NICU DVT Evaluation: No evidence of DVT seen on physical exam.  Recent Labs    03/01/18 1507 03/02/18 0602  HGB 10.9* 9.2*  HCT 32.7* 27.3*    Assessment/Plan: Status post Cesarean section. Doing well postoperatively.  Continue current care. Baby in NICU due to abnormal EKG and risk of ventricular tachycardia.  Vanessa Kick 03/02/2018, 9:26 PM

## 2018-03-02 NOTE — Lactation Note (Signed)
This note was copied from a baby's chart. Lactation Consultation Note  Patient Name: Haley Rodgers [id number] Date: 03/02/2018 Reason for consult: Initial assessment;1st time breastfeeding;Primapara;Early term 37-38.6wks  P1 mother whose infant is now 62 hours old.    Mother's breasts are soft and non tender with flat nipples bilaterally.  She already has a #20 NS at bedside.  She would like to try this intervention for now. Reviewed how to apply NS effectively.  Assisted baby to latch in the football hold on the right breast without success.  He was able to latch using the NS but put forth no effort to suck.  He was very sleepy.  Demonstrated breast compressions and how to awaken a sleepy baby but he still would not suck.  Reassured mother that this was normal at this time and to continue STS  Encouraged to feed 8-12 times/24 hours or more if he shows feeding cues.  Continue STS, breast massage and hand expression.  Mother will call for latch assistance as needed. Family present and supportive.  RN came into the room and was updated.   Maternal Data Formula Feeding for Exclusion: No Has patient been taught Hand Expression?: Yes Does the patient have breastfeeding experience prior to this delivery?: No  Feeding Feeding Type: Breast Fed Length of feed: 0 min  LATCH Score Latch: Too sleepy or reluctant, no latch achieved, no sucking elicited.  Audible Swallowing: None  Type of Nipple: Flat  Comfort (Breast/Nipple): Soft / non-tender  Hold (Positioning): Assistance needed to correctly position infant at breast and maintain latch.  LATCH Score: 4  Interventions Interventions: Breast feeding basics reviewed;Assisted with latch;Skin to skin;Breast massage;Hand express;Position options;Support pillows;Adjust position;Breast compression  Lactation Tools Discussed/Used Tools: Nipple Shields Nipple shield size: 20   Consult Status Consult Status: Follow-up Date: 03/03/18 Follow-up  type: In-patient    Tyron Manetta R Sullivan Jacuinde 03/02/2018, 12:18 AM

## 2018-03-02 NOTE — Lactation Note (Signed)
This note was copied from a baby's chart. Lactation Consultation Note  Patient Name: Haley Rodgers [id number] Date: 03/02/2018 Reason for consult: Follow-up assessment;NICU baby Baby transferred to NICU this morning.  Called to assist mom with waking techniques and latch.  Baby is 46 hours old and has been sleepy.  He is in a quiet, alert state now.  Mom using a 20 mm nipple shield.  Baby latched easily in cross cradle hold and started sucking with gentle stimulation.  Discussed with mom that we would like her to pump 8-12 times per day for breast stimulation.  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  Symphony pump set up and will instruct mom when she returns to room.  Maternal Data    Feeding Feeding Type: Breast Fed Nipple Type: Slow - flow  LATCH Score Latch: Grasps breast easily, tongue down, lips flanged, rhythmical sucking.  Audible Swallowing: A few with stimulation  Type of Nipple: Everted at rest and after stimulation  Comfort (Breast/Nipple): Soft / non-tender  Hold (Positioning): Assistance needed to correctly position infant at breast and maintain latch.  LATCH Score: 8  Interventions Interventions: Assisted with latch;Adjust position;Breast massage;Support pillows;DEBP  Lactation Tools Discussed/Used Tools: Nipple Shields Nipple shield size: 20   Consult Status Consult Status: Follow-up Date: 03/03/18 Follow-up type: In-patient    Ave Filter 03/02/2018, 2:30 PM

## 2018-03-03 NOTE — Lactation Note (Addendum)
This note was copied from a baby's chart. Lactation Consultation Note  Patient Name: Haley Rodgers [id number] Date: 03/03/2018   Symphony DEBP set up in room.   NICU Lactation booklet left with GMOB as MOB is in NICU with baby.  GMOB unsure about how much pumping Mom is doing.    Encouraged Mom to call when she returns to her room, to talk to [facility] about importance of double pumping/breast massage/hand expression/skin to skin is to stimulate her milk supply.   Consult Status  Follow-up 7/3 prn when Mom calls.    Broadus John 03/03/2018, 3:26 PM

## 2018-03-03 NOTE — Progress Notes (Signed)
Patient screened out for psychosocial assessment since none of the following apply: °Psychosocial stressors documented in mother or baby's chart °Gestation less than 32 weeks °Code at delivery  °Infant with anomalies °Please contact the Clinical Social Worker if specific needs arise, by MOB's request, or if MOB scores greater than 9/yes to question 10 on [place] Postpartum Depression Screen. ° °Hal Norrington Boyd-Gilyard, MSW, LCSW °Clinical Social Work °[phone] °  °

## 2018-03-03 NOTE — Progress Notes (Signed)
Patient is doing well.  She is tolerating PO, ambulating, voiding.  Pain is controlled.  Lochia is appropriate  Vitals:   03/02/18 0606 03/02/18 1147 03/02/18 2238 03/03/18 0600  BP: 90/62 109/79 112/77 109/72  Pulse: (!) 55 66 83 74  Resp: 18 16 16 14   Temp: 98.3 F (36.8 C) 98 F (36.7 C) 97.9 F (36.6 C) 98.1 F (36.7 C)  TempSrc: Oral Oral Oral Oral  SpO2: 94% 98% 99% 98%  Weight:      Height:        NAD Abdomen:  soft, appropriate tenderness, incisions intact and without erythema ext:    Symmetric, trace edema bilaterally  Lab Results  Component Value Date   WBC 8.5 03/02/2018   HGB 9.2 (L) 03/02/2018   HCT 27.3 (L) 03/02/2018   MCV 84.0 03/02/2018   PLT 126 (L) 03/02/2018    --/--/O NEG Performed at [facility], [address] [place], [place] [address]  (07/01 2020)/RImmune  A/P    32 y.o. G4P0030 POD 2 s/p primary CD Routine post op and postpartum care.   GDMA1--2hr gtt at 6 weeks GHTN--BPs wnl since delivery Baby in NICU for evaluation of possible ventricular tachycardia  Rh neg--baby O neg

## 2018-03-04 ENCOUNTER — Encounter (HOSPITAL_COMMUNITY): Payer: Self-pay | Admitting: *Deleted

## 2018-03-04 ENCOUNTER — Other Ambulatory Visit: Payer: Self-pay

## 2018-03-04 NOTE — Progress Notes (Signed)
Patient is eating, ambulating, voiding.  Pain control is good.  Appropriate lochia, no complaints.  Vitals:   03/02/18 0606 03/02/18 1147 03/02/18 2238 03/03/18 0600  BP: 90/62 109/79 112/77 109/72  Pulse: (!) 55 66 83 74  Resp: 18 16 16 14   Temp: 98.3 F (36.8 C) 98 F (36.7 C) 97.9 F (36.6 C) 98.1 F (36.7 C)  TempSrc: Oral Oral Oral Oral  SpO2: 94% 98% 99% 98%  Weight:      Height:        Fundus firm Inc: c/d/i Ext: no calf tenderness  Lab Results  Component Value Date   WBC 8.5 03/02/2018   HGB 9.2 (L) 03/02/2018   HCT 27.3 (L) 03/02/2018   MCV 84.0 03/02/2018   PLT 126 (L) 03/02/2018    --/--/O NEG Performed at [facility], [address], [place], [place] [address]  (07/01 2020)  A/P Post op day #3 s/p c/s for fetal v-tach Baby in NICU doing well, cards to continue eval today, baby may be able to go home tomorrow.  Will needs circ prior to d/c.  Mom consented  Overall doing well. GHTN- Bps normal since delivery  Routine care.  Expect d/c 7/5.    Haley Rodgers

## 2018-03-04 NOTE — Lactation Note (Signed)
This note was copied from a baby's chart. Lactation Consultation Note  Patient Name: Haley Rodgers [id number] Date: 03/04/2018 Reason for consult: Follow-up assessment;Primapara;1st time breastfeeding;NICU baby;Early term 46-38.6wks   Visited with P1 Mom of baby in the NICU.  Baby 68 hrs old.  Mom states she has been pumping every 3 hrs.  Both breasts full, and compressible.  Mom only obtained drops and is concerned about this.  Talked about double pumping more frequently.  Mom to use breast massage, and warm compresses prior to pumping.  If breasts become engorged, Mom to ask for ice packs 20 mins between pumping.  Mom states she still occasionally puts baby onto breast using nipple shield.  Baby is sleepy.  Recommended she pump after STS or breastfeeding.  [facility] pump given to her.   Mom knows to take her pump parts with her.  She may get to room-in with her baby tonight.  To call for assistance prn.  OP lactation services reviewed.  Consult Status Consult Status: Follow-up Date: 03/04/18 Follow-up type: In-patient    Haley Rodgers 03/04/2018, 12:05 PM

## 2018-03-04 NOTE — Lactation Note (Signed)
This note was copied from a baby's chart. Lactation Consultation Note  Patient Name: Haley Rodgers URKYH'[id number] Date: 03/04/2018 Reason for consult: Follow-up assessment;Primapara;1st time breastfeeding;NICU baby;Early term 37-38.6wks;Engorgement  Mom with 18 hours old NICU baby, RN called in because mom is getting engorged. Mom had already put some ice packs on both of her breast for 20 minutes upon entering the room and she had already pump about an hour ago. Per mom she has been pumping every 3 hours, reviewed pump set up and noticed that one of the tubing pieces was loose. LC adjusted it and let mom know what she'll feel a difference in the suction level.  Showed mom how to do hand massage and breast compressions along with breast knitting, when doing hand expressions she was able to get two small drops of colostrum out of each breast, the right one was the challenging one, it was easier to express from the left one.   Advised mom to keep doing the ice compresses on both breast for 20 minutes prior pumping, following by moist heat, after that she'll try to hand express (breast massage, breast compressions) before pumping for 15 minutes every 3 hours. Reviewed milk storage guidelines, she'll be taking any amount of EBM to the NICU. Both parents aware of [place] services and will call PRN.  Maternal Data    Feeding Feeding Type: Formula Nipple Type: Slow - flow Length of feed: 20 min    Interventions Interventions: Breast feeding basics reviewed;Breast massage;Breast compression;Hand express;DEBP;Ice  Lactation Tools Discussed/Used     Consult Status Consult Status: Follow-up Date: 03/05/18 Follow-up type: In-patient    Haley Rodgers 03/04/2018, 10:31 PM

## 2018-03-05 ENCOUNTER — Encounter (HOSPITAL_COMMUNITY): Payer: Self-pay | Admitting: *Deleted

## 2018-03-05 MED ORDER — ACETAMINOPHEN FOR CIRCUMCISION 160 MG/5 ML
ORAL | Status: AC
  Filled 2018-03-05: qty 1.25

## 2018-03-05 MED ORDER — LIDOCAINE 1% INJECTION FOR CIRCUMCISION
INJECTION | INTRAVENOUS | Status: AC
  Filled 2018-03-05: qty 1

## 2018-03-05 MED ORDER — OXYCODONE-ACETAMINOPHEN 5-325 MG PO TABS: 1.0000 | ORAL_TABLET | ORAL | 0 refills | Status: DC | PRN

## 2018-03-05 MED ORDER — SUCROSE 24% NICU/PEDS ORAL SOLUTION
OROMUCOSAL | Status: AC
  Filled 2018-03-05: qty 1

## 2018-03-05 MED ORDER — GELATIN ABSORBABLE 12-7 MM EX MISC
CUTANEOUS | Status: AC
  Filled 2018-03-05: qty 1

## 2018-03-05 NOTE — Discharge Summary (Signed)
Obstetric Discharge Summary Reason for Admission: induction of labor Prenatal Procedures: NST Intrapartum Procedures: cesarean: low cervical, transverse Postpartum Procedures: Rubella Ig Complications-Operative and Postpartum: none Hemoglobin  Date Value Ref Range Status  03/02/2018 9.2 (L) 12.0 - 15.0 g/dL Final   HCT  Date Value Ref Range Status  03/02/2018 27.3 (L) 36.0 - 46.0 % Final    Discharge Diagnoses: Term Pregnancy-delivered  Discharge Information: Date: 03/05/2018 Activity: pelvic rest Diet: routine Medications: Ibuprofen, Iron and Percocet Condition: stable Instructions: refer to practice specific booklet Discharge to: home Follow-up Information    Olga Millers, MD Follow up in 4 week(s).   Specialty:  Obstetrics and Gynecology Contact information: [address] [place] [address] [phone]           Newborn Data: Live born female  Birth Weight: 7 lb 7.2 oz (3380 g) APGAR: 8, 9  Newborn Delivery   Birth date/time:  03/01/2018 18:34:00 Delivery type:  C-Section, Vacuum Assisted Trial of labor:  No C-section categorization:  Primary     NICU for tachycardia.  Haley Rodgers 03/05/2018, 8:09 AM

## 2018-03-05 NOTE — Progress Notes (Signed)
Patient is eating, ambulating, voiding.  Pain control is good.  Vitals:   03/03/18 0600 03/04/18 1540 03/04/18 2152 03/05/18 0616  BP: 109/72 123/86 122/86 (!) 120/55  Pulse: 74 82 61 (!) 58  Resp: '14 18 18   '$ Temp: 98.1 F (36.7 C) 98 F (36.7 C) 98.3 F (36.8 C)   TempSrc: Oral Oral Oral   SpO2: 98% 99%    Weight:      Height:        Fundus firm Perineum without swelling.  Lab Results  Component Value Date   WBC 8.5 03/02/2018   HGB 9.2 (L) 03/02/2018   HCT 27.3 (L) 03/02/2018   MCV 84.0 03/02/2018   PLT 126 (L) 03/02/2018    --/--/O NEG Performed at [facility], [address] [place],  [address]  (07/01 2020)/ Rubella not recorded anywhere- checking.  A/P Post partum/OP day 4.  Routine care.  Expect d/c today.   Baby is RH neg- no Rhogam needed.  Rubella is not recorded anywhere.  Checking immunity today and will give mmr if neg.   Haley Rodgers A

## 2018-03-05 NOTE — Progress Notes (Signed)
Dr. Philis Pique notified that, per lab, the Rubella antibody test is a 1 day turn around.  Dr. Philis Pique gave okay to d/c pt and that the office will follow up with the status of the test.

## 2018-03-06 LAB — RUBELLA ANTIBODY, IGM: Rubella IgM: <20 AU/mL (ref 0.0–19.9)

## 2018-03-08 ENCOUNTER — Other Ambulatory Visit: Payer: Self-pay | Admitting: *Deleted

## 2018-03-08 NOTE — Patient Outreach (Signed)
[facility]) Care Management  03/08/2018  Haley Rodgers 01-08-1986 [id number]   Attempted to reach [facility] via cell phone number to complete transition of care assessment.  [name] was admitted to [facility] on 03/01/18 at [redacted] weeks gestation for induction of labor for pregnancy induced hypertension and slow and irregular fetal heart tones. She had an emergency C section on 7/1, baby boy delivered weighing 7 lbs 7.2 ozs.  Left message on Haley Rodgers's mobile number requesting return call.  Barrington Ellison RN,CCM,CDE [place] Management Coordinator Office Phone [phone] Office Fax [phone]

## 2018-03-09 ENCOUNTER — Other Ambulatory Visit: Payer: Self-pay | Admitting: *Deleted

## 2018-03-09 NOTE — Patient Outreach (Addendum)
[facility]) Care Management  03/09/2018  Wynema Garoutte Chicas 1986/03/05 [id number]   Second outreach attempt successful in reaching [facility] via cell phone number to complete transition of care assessment. Jhoselyn was receiving gestational diabetes self management assistance. See transition of care template for details. Hilaria was admitted to [facility] on 03/01/18 at [redacted] weeks gestation for induction of labor for pregnancy induced hypertension and slow and irregular fetal heart tones. She had an emergency C section on 7/1, baby boy Colton was delivered weighing 7 lbs 7.2 ozs. She was discharged home with the baby on 03/05/18.  Cilicia says Colton was discharged from the NICU on a home heart monitor and they will see a pediatric cardiologist tomorrow to follow up on baby's cardiac dysrhythmias. She said she was told the dysrhythmias are not that unusual and that the baby usually grows out of them. She says they did a cardiac echo and the baby's heart had no structural abnormalities . Demetris says she was not directed to check her blood sugar when she received discharge instructions so she has not.  Discussed plan to call Aniylah in 12 weeks to discuss outcome of postpartum glucose tolerance test to see if gestationals diabetes resolved. Manuelita in agreement with the follow up plan.   Barrington Ellison RN,CCM,CDE [place] Management Coordinator Office Phone [phone] Office Fax [phone]

## 2018-03-30 DIAGNOSIS — Z124 Encounter for screening for malignant neoplasm of cervix: Secondary | ICD-10-CM | POA: Diagnosis not present

## 2018-03-30 MED FILL — METOCLOPRAMIDE 10 MG TABLET: 10 | 15 days supply | Qty: 60 | Fill #0

## 2018-05-24 ENCOUNTER — Other Ambulatory Visit: Payer: Self-pay | Admitting: *Deleted

## 2018-05-24 NOTE — Patient Outreach (Addendum)
[facility]) Care Management  05/24/2018  Haley Rodgers Apr 07, 1986 [id number]   Spoke with Haley Rodgers to assess status of gestational diabetes. She says she had her postpartum check up and they did blood work and they told her she no longer has gestational diabetes. She also says she and Haley Rodgers are doing well. Haley Rodgers saw a cardiologist for one visit and is no longer on any cardiac medications. Haley Rodgers says she si doing well and has no complaints. Will close [name] to the [facility] and to [place] [facility] services.  Barrington Ellison RN,CCM,CDE [place] Management Coordinator Office Phone [phone] Office Fax [phone]

## 2018-12-13 DIAGNOSIS — E663 Overweight: Secondary | ICD-10-CM | POA: Diagnosis not present

## 2018-12-13 DIAGNOSIS — Z0000 Encounter for general adult medical examination without abnormal findings: Secondary | ICD-10-CM | POA: Diagnosis not present

## 2018-12-13 DIAGNOSIS — Z1331 Encounter for screening for depression: Secondary | ICD-10-CM | POA: Diagnosis not present

## 2018-12-13 DIAGNOSIS — O24419 Gestational diabetes mellitus in pregnancy, unspecified control: Secondary | ICD-10-CM | POA: Diagnosis not present

## 2018-12-15 DIAGNOSIS — O24419 Gestational diabetes mellitus in pregnancy, unspecified control: Secondary | ICD-10-CM | POA: Diagnosis not present

## 2018-12-15 DIAGNOSIS — Z0000 Encounter for general adult medical examination without abnormal findings: Secondary | ICD-10-CM | POA: Diagnosis not present

## 2018-12-29 ENCOUNTER — Other Ambulatory Visit: Payer: Self-pay | Admitting: *Deleted

## 2019-01-21 DIAGNOSIS — H5213 Myopia, bilateral: Secondary | ICD-10-CM | POA: Diagnosis not present

## 2019-04-11 DIAGNOSIS — Z01419 Encounter for gynecological examination (general) (routine) without abnormal findings: Secondary | ICD-10-CM | POA: Diagnosis not present

## 2019-05-31 MED FILL — MEDROXYPROGESTERONE 10 MG T: 10 | 5 days supply | Qty: 5 | Fill #0

## 2019-12-12 DIAGNOSIS — Z319 Encounter for procreative management, unspecified: Secondary | ICD-10-CM | POA: Diagnosis not present

## 2019-12-19 DIAGNOSIS — E663 Overweight: Secondary | ICD-10-CM | POA: Diagnosis not present

## 2019-12-19 DIAGNOSIS — O24419 Gestational diabetes mellitus in pregnancy, unspecified control: Secondary | ICD-10-CM | POA: Diagnosis not present

## 2019-12-19 DIAGNOSIS — R7889 Finding of other specified substances, not normally found in blood: Secondary | ICD-10-CM | POA: Diagnosis not present

## 2019-12-19 DIAGNOSIS — Z0000 Encounter for general adult medical examination without abnormal findings: Secondary | ICD-10-CM | POA: Diagnosis not present

## 2019-12-19 DIAGNOSIS — R82998 Other abnormal findings in urine: Secondary | ICD-10-CM | POA: Diagnosis not present

## 2019-12-23 DIAGNOSIS — Z0000 Encounter for general adult medical examination without abnormal findings: Secondary | ICD-10-CM | POA: Diagnosis not present

## 2019-12-23 DIAGNOSIS — O24419 Gestational diabetes mellitus in pregnancy, unspecified control: Secondary | ICD-10-CM | POA: Diagnosis not present

## 2019-12-23 DIAGNOSIS — E663 Overweight: Secondary | ICD-10-CM | POA: Diagnosis not present

## 2020-02-17 DIAGNOSIS — H5213 Myopia, bilateral: Secondary | ICD-10-CM | POA: Diagnosis not present

## 2020-02-24 DIAGNOSIS — N8500 Endometrial hyperplasia, unspecified: Secondary | ICD-10-CM | POA: Diagnosis not present

## 2020-02-24 DIAGNOSIS — Z3141 Encounter for fertility testing: Secondary | ICD-10-CM | POA: Diagnosis not present

## 2020-02-24 MED FILL — METHYLPREDNISOLONE 4 MG TAB: 4 | 4 days supply | Qty: 16 | Fill #0

## 2020-02-24 MED FILL — ESTRADIOL 2 MG TABS: 2 | 30 days supply | Qty: 60 | Fill #0

## 2020-02-24 MED FILL — PROGESTERONE OIL 50 MG/ML V: 50 | 30 days supply | Qty: 30 | Fill #0

## 2020-02-24 MED FILL — BD NEEDLES 22GX1.5: 22G X 1-1/2 | 30 days supply | Qty: 30 | Fill #0

## 2020-02-24 MED FILL — BD NEEDLES 30GX0.5: 30G X 1/2" | 30 days supply | Qty: 30 | Fill #0

## 2020-02-24 MED FILL — BD 3 ML SYRINGE 18GX1-1/2: 18G X 1-1/2 | 30 days supply | Qty: 30 | Fill #0

## 2020-02-24 MED FILL — ESTRADIOL 0.1 MG PATCH: 0.1 | 28 days supply | Qty: 8 | Fill #0

## 2020-03-30 DIAGNOSIS — Z3183 Encounter for assisted reproductive fertility procedure cycle: Secondary | ICD-10-CM | POA: Diagnosis not present

## 2020-03-30 DIAGNOSIS — Z113 Encounter for screening for infections with a predominantly sexual mode of transmission: Secondary | ICD-10-CM | POA: Diagnosis not present

## 2020-04-07 DIAGNOSIS — Z3183 Encounter for assisted reproductive fertility procedure cycle: Secondary | ICD-10-CM | POA: Diagnosis not present

## 2020-04-09 MED FILL — ESTRADIOL 0.1 MG PATCH: 0.1 | 28 days supply | Qty: 8 | Fill #1

## 2020-04-13 MED FILL — ESTRADIOL 2 MG TABS: 2 | 30 days supply | Qty: 60 | Fill #1

## 2020-04-16 DIAGNOSIS — Z3200 Encounter for pregnancy test, result unknown: Secondary | ICD-10-CM | POA: Diagnosis not present

## 2020-04-16 DIAGNOSIS — Z3201 Encounter for pregnancy test, result positive: Secondary | ICD-10-CM | POA: Diagnosis not present

## 2020-04-18 DIAGNOSIS — Z3200 Encounter for pregnancy test, result unknown: Secondary | ICD-10-CM | POA: Diagnosis not present

## 2020-04-18 DIAGNOSIS — Z3201 Encounter for pregnancy test, result positive: Secondary | ICD-10-CM | POA: Diagnosis not present

## 2020-04-24 MED FILL — PROGESTERONE OIL 50 MG/ML V: 50 | 30 days supply | Qty: 30 | Fill #1

## 2020-04-24 MED FILL — BD 3 ML SYRINGE 18GX1-1/2: 18G X 1-1/2 | 30 days supply | Qty: 30 | Fill #1

## 2020-04-24 MED FILL — BD NEEDLES 22GX1.5: 22G X 1-1/2 | 30 days supply | Qty: 30 | Fill #1

## 2020-04-30 DIAGNOSIS — Z3200 Encounter for pregnancy test, result unknown: Secondary | ICD-10-CM | POA: Diagnosis not present

## 2020-05-01 MED FILL — ESTRADIOL 0.1 MG PATCH: 0.1 | 28 days supply | Qty: 8 | Fill #0

## 2020-05-08 DIAGNOSIS — O200 Threatened abortion: Secondary | ICD-10-CM | POA: Diagnosis not present

## 2020-05-14 DIAGNOSIS — O0900 Supervision of pregnancy with history of infertility, unspecified trimester: Secondary | ICD-10-CM | POA: Diagnosis not present

## 2020-05-15 MED FILL — PROGESTERONE MICRONIZED 200: 200 | 14 days supply | Qty: 14 | Fill #0

## 2020-05-29 DIAGNOSIS — U071 COVID-19: Secondary | ICD-10-CM | POA: Diagnosis not present

## 2020-05-29 DIAGNOSIS — Z20828 Contact with and (suspected) exposure to other viral communicable diseases: Secondary | ICD-10-CM | POA: Diagnosis not present

## 2020-05-30 DIAGNOSIS — U071 COVID-19: Secondary | ICD-10-CM | POA: Diagnosis not present

## 2020-05-30 DIAGNOSIS — Z20828 Contact with and (suspected) exposure to other viral communicable diseases: Secondary | ICD-10-CM | POA: Diagnosis not present

## 2020-06-08 DIAGNOSIS — R9389 Abnormal findings on diagnostic imaging of other specified body structures: Secondary | ICD-10-CM | POA: Diagnosis not present

## 2020-06-08 DIAGNOSIS — R8290 Unspecified abnormal findings in urine: Secondary | ICD-10-CM | POA: Diagnosis not present

## 2020-06-08 DIAGNOSIS — O26841 Uterine size-date discrepancy, first trimester: Secondary | ICD-10-CM | POA: Diagnosis not present

## 2020-06-08 DIAGNOSIS — Z3480 Encounter for supervision of other normal pregnancy, unspecified trimester: Secondary | ICD-10-CM | POA: Diagnosis not present

## 2020-06-08 DIAGNOSIS — O09521 Supervision of elderly multigravida, first trimester: Secondary | ICD-10-CM | POA: Diagnosis not present

## 2020-06-08 LAB — OB RESULTS CONSOLE HIV ANTIBODY (ROUTINE TESTING): HIV: NONREACTIVE

## 2020-06-08 LAB — OB RESULTS CONSOLE GC/CHLAMYDIA
Chlamydia: NEGATIVE
Gonorrhea: NEGATIVE

## 2020-06-08 LAB — OB RESULTS CONSOLE ANTIBODY SCREEN: Antibody Screen: NEGATIVE

## 2020-06-08 LAB — OB RESULTS CONSOLE RUBELLA ANTIBODY, IGM: Rubella: IMMUNE

## 2020-06-08 LAB — OB RESULTS CONSOLE ABO/RH

## 2020-06-08 LAB — OB RESULTS CONSOLE HEPATITIS B SURFACE ANTIGEN: Hepatitis B Surface Ag: NEGATIVE

## 2020-06-08 LAB — OB RESULTS CONSOLE RPR: RPR: NONREACTIVE

## 2020-06-11 ENCOUNTER — Other Ambulatory Visit: Payer: Self-pay | Admitting: Obstetrics

## 2020-06-11 DIAGNOSIS — Z3A19 19 weeks gestation of pregnancy: Secondary | ICD-10-CM

## 2020-06-11 DIAGNOSIS — R9389 Abnormal findings on diagnostic imaging of other specified body structures: Secondary | ICD-10-CM

## 2020-06-26 ENCOUNTER — Ambulatory Visit: Payer: 59

## 2020-06-27 ENCOUNTER — Encounter: Payer: Self-pay | Admitting: *Deleted

## 2020-06-29 ENCOUNTER — Other Ambulatory Visit: Payer: Self-pay | Admitting: Obstetrics

## 2020-06-29 ENCOUNTER — Ambulatory Visit (HOSPITAL_BASED_OUTPATIENT_CLINIC_OR_DEPARTMENT_OTHER): Payer: 59

## 2020-06-29 ENCOUNTER — Ambulatory Visit: Payer: 59 | Attending: Obstetrics | Admitting: *Deleted

## 2020-06-29 ENCOUNTER — Encounter: Payer: Self-pay | Admitting: *Deleted

## 2020-06-29 ENCOUNTER — Other Ambulatory Visit: Payer: Self-pay

## 2020-06-29 DIAGNOSIS — Z3A14 14 weeks gestation of pregnancy: Secondary | ICD-10-CM | POA: Insufficient documentation

## 2020-06-29 DIAGNOSIS — O09812 Supervision of pregnancy resulting from assisted reproductive technology, second trimester: Secondary | ICD-10-CM | POA: Diagnosis not present

## 2020-06-29 DIAGNOSIS — O09522 Supervision of elderly multigravida, second trimester: Secondary | ICD-10-CM | POA: Insufficient documentation

## 2020-06-29 DIAGNOSIS — O289 Unspecified abnormal findings on antenatal screening of mother: Secondary | ICD-10-CM

## 2020-06-29 DIAGNOSIS — O09292 Supervision of pregnancy with other poor reproductive or obstetric history, second trimester: Secondary | ICD-10-CM | POA: Diagnosis not present

## 2020-06-29 DIAGNOSIS — Z3A19 19 weeks gestation of pregnancy: Secondary | ICD-10-CM | POA: Diagnosis not present

## 2020-06-29 DIAGNOSIS — O34219 Maternal care for unspecified type scar from previous cesarean delivery: Secondary | ICD-10-CM | POA: Diagnosis not present

## 2020-06-29 DIAGNOSIS — O34212 Maternal care for vertical scar from previous cesarean delivery: Secondary | ICD-10-CM

## 2020-06-29 DIAGNOSIS — R9389 Abnormal findings on diagnostic imaging of other specified body structures: Secondary | ICD-10-CM | POA: Diagnosis not present

## 2020-06-29 DIAGNOSIS — Z363 Encounter for antenatal screening for malformations: Secondary | ICD-10-CM | POA: Diagnosis not present

## 2020-07-06 DIAGNOSIS — Z369 Encounter for antenatal screening, unspecified: Secondary | ICD-10-CM | POA: Diagnosis not present

## 2020-07-25 ENCOUNTER — Ambulatory Visit: Payer: 59

## 2020-07-30 ENCOUNTER — Other Ambulatory Visit: Payer: Self-pay

## 2020-07-30 ENCOUNTER — Encounter: Payer: Self-pay | Admitting: *Deleted

## 2020-07-30 ENCOUNTER — Other Ambulatory Visit: Payer: Self-pay | Admitting: *Deleted

## 2020-07-30 ENCOUNTER — Ambulatory Visit: Payer: 59 | Admitting: *Deleted

## 2020-07-30 ENCOUNTER — Ambulatory Visit: Payer: 59 | Attending: Obstetrics

## 2020-07-30 DIAGNOSIS — O283 Abnormal ultrasonic finding on antenatal screening of mother: Secondary | ICD-10-CM | POA: Insufficient documentation

## 2020-07-30 DIAGNOSIS — R9389 Abnormal findings on diagnostic imaging of other specified body structures: Secondary | ICD-10-CM

## 2020-07-30 DIAGNOSIS — O24112 Pre-existing diabetes mellitus, type 2, in pregnancy, second trimester: Secondary | ICD-10-CM | POA: Diagnosis not present

## 2020-07-30 DIAGNOSIS — Z363 Encounter for antenatal screening for malformations: Secondary | ICD-10-CM | POA: Insufficient documentation

## 2020-07-30 DIAGNOSIS — O09522 Supervision of elderly multigravida, second trimester: Secondary | ICD-10-CM | POA: Diagnosis not present

## 2020-07-30 DIAGNOSIS — Z3A19 19 weeks gestation of pregnancy: Secondary | ICD-10-CM | POA: Diagnosis not present

## 2020-07-30 DIAGNOSIS — O289 Unspecified abnormal findings on antenatal screening of mother: Secondary | ICD-10-CM

## 2020-07-30 DIAGNOSIS — O09812 Supervision of pregnancy resulting from assisted reproductive technology, second trimester: Secondary | ICD-10-CM | POA: Diagnosis not present

## 2020-07-30 DIAGNOSIS — O09292 Supervision of pregnancy with other poor reproductive or obstetric history, second trimester: Secondary | ICD-10-CM | POA: Insufficient documentation

## 2020-07-30 DIAGNOSIS — E118 Type 2 diabetes mellitus with unspecified complications: Secondary | ICD-10-CM | POA: Diagnosis not present

## 2020-07-30 DIAGNOSIS — O34219 Maternal care for unspecified type scar from previous cesarean delivery: Secondary | ICD-10-CM | POA: Insufficient documentation

## 2020-07-30 DIAGNOSIS — O34212 Maternal care for vertical scar from previous cesarean delivery: Secondary | ICD-10-CM

## 2020-07-30 DIAGNOSIS — O09813 Supervision of pregnancy resulting from assisted reproductive technology, third trimester: Secondary | ICD-10-CM | POA: Diagnosis not present

## 2020-08-03 DIAGNOSIS — Z369 Encounter for antenatal screening, unspecified: Secondary | ICD-10-CM | POA: Diagnosis not present

## 2020-08-12 DIAGNOSIS — Z20822 Contact with and (suspected) exposure to covid-19: Secondary | ICD-10-CM | POA: Diagnosis not present

## 2020-08-14 ENCOUNTER — Other Ambulatory Visit: Payer: Self-pay | Admitting: Obstetrics

## 2020-08-14 DIAGNOSIS — O98512 Other viral diseases complicating pregnancy, second trimester: Secondary | ICD-10-CM

## 2020-08-14 DIAGNOSIS — U071 COVID-19: Secondary | ICD-10-CM

## 2020-08-14 MED ORDER — EPINEPHRINE PF 1 MG/ML IJ SOLN: 0.3000 mg | Freq: Once | INTRAMUSCULAR | Status: AC | PRN

## 2020-08-14 MED ORDER — DIPHENHYDRAMINE HCL 50 MG/ML IJ SOLN: 50.0000 mg | Freq: Once | INTRAMUSCULAR | Status: AC | PRN

## 2020-08-14 MED ORDER — ALBUTEROL SULFATE HFA 108 (90 BASE) MCG/ACT IN AERS: 2.0000 | INHALATION_SPRAY | Freq: Once | RESPIRATORY_TRACT | Status: AC | PRN

## 2020-08-14 MED ORDER — CASIRIVIMAB-IMDEVIMAB 600-600 MG/10ML IJ SOLN: 300.0000 mg | INTRAMUSCULAR | Status: AC

## 2020-08-14 MED ORDER — METHYLPREDNISOLONE SODIUM SUCC 125 MG IJ SOLR
125.0000 mg | Freq: Once | INTRAMUSCULAR | Status: AC | PRN
Start: 2020-08-14 — End: 2020-08-14

## 2020-08-15 ENCOUNTER — Telehealth (HOSPITAL_COMMUNITY): Payer: Self-pay

## 2020-08-15 NOTE — Telephone Encounter (Signed)
Called to Discuss with patient about Covid symptoms and the use of the monoclonal antibody infusion for those with mild to moderate Covid symptoms and at a high risk of hospitalization.     Pt appears to qualify for this infusion due to co-morbid conditions and/or a member of an at-risk group in accordance with the FDA Emergency Use Authorization.    Pt stated her symptoms are mild and wants to speak to her OB/Gyn before proceeding. Pt will call back if interested in treatment.

## 2020-08-19 ENCOUNTER — Other Ambulatory Visit: Payer: Self-pay

## 2020-08-19 ENCOUNTER — Inpatient Hospital Stay (HOSPITAL_COMMUNITY)
Admission: AD | Admit: 2020-08-19 | Discharge: 2020-08-22 | DRG: 831 | Disposition: A | Discharge status: Home / Self Care | Payer: 59 | Attending: Obstetrics & Gynecology | Admitting: Obstetrics & Gynecology

## 2020-08-19 ENCOUNTER — Encounter (HOSPITAL_COMMUNITY): Payer: Self-pay | Admitting: Obstetrics and Gynecology

## 2020-08-19 ENCOUNTER — Inpatient Hospital Stay (HOSPITAL_COMMUNITY): Payer: 59

## 2020-08-19 DIAGNOSIS — Z3A22 22 weeks gestation of pregnancy: Secondary | ICD-10-CM

## 2020-08-19 DIAGNOSIS — R0602 Shortness of breath: Secondary | ICD-10-CM | POA: Diagnosis not present

## 2020-08-19 DIAGNOSIS — O99512 Diseases of the respiratory system complicating pregnancy, second trimester: Secondary | ICD-10-CM | POA: Diagnosis present

## 2020-08-19 DIAGNOSIS — R000 Tachycardia, unspecified: Secondary | ICD-10-CM | POA: Diagnosis present

## 2020-08-19 DIAGNOSIS — J90 Pleural effusion, not elsewhere classified: Secondary | ICD-10-CM | POA: Diagnosis not present

## 2020-08-19 DIAGNOSIS — O99282 Endocrine, nutritional and metabolic diseases complicating pregnancy, second trimester: Secondary | ICD-10-CM | POA: Diagnosis present

## 2020-08-19 DIAGNOSIS — J1282 Pneumonia due to coronavirus disease 2019: Secondary | ICD-10-CM | POA: Diagnosis not present

## 2020-08-19 DIAGNOSIS — E876 Hypokalemia: Secondary | ICD-10-CM | POA: Diagnosis not present

## 2020-08-19 DIAGNOSIS — U071 COVID-19: Secondary | ICD-10-CM | POA: Diagnosis not present

## 2020-08-19 DIAGNOSIS — N63 Unspecified lump in unspecified breast: Secondary | ICD-10-CM | POA: Diagnosis present

## 2020-08-19 DIAGNOSIS — E871 Hypo-osmolality and hyponatremia: Secondary | ICD-10-CM | POA: Diagnosis present

## 2020-08-19 DIAGNOSIS — O34219 Maternal care for unspecified type scar from previous cesarean delivery: Secondary | ICD-10-CM | POA: Diagnosis present

## 2020-08-19 DIAGNOSIS — J9601 Acute respiratory failure with hypoxia: Secondary | ICD-10-CM | POA: Diagnosis not present

## 2020-08-19 DIAGNOSIS — I2699 Other pulmonary embolism without acute cor pulmonale: Secondary | ICD-10-CM | POA: Diagnosis not present

## 2020-08-19 DIAGNOSIS — J189 Pneumonia, unspecified organism: Secondary | ICD-10-CM | POA: Diagnosis not present

## 2020-08-19 DIAGNOSIS — O98512 Other viral diseases complicating pregnancy, second trimester: Principal | ICD-10-CM | POA: Diagnosis present

## 2020-08-19 DIAGNOSIS — R059 Cough, unspecified: Secondary | ICD-10-CM

## 2020-08-19 DIAGNOSIS — R7989 Other specified abnormal findings of blood chemistry: Secondary | ICD-10-CM | POA: Diagnosis not present

## 2020-08-19 DIAGNOSIS — O99891 Other specified diseases and conditions complicating pregnancy: Secondary | ICD-10-CM | POA: Diagnosis not present

## 2020-08-19 LAB — BRAIN NATRIURETIC PEPTIDE: B Natriuretic Peptide: 39.8 pg/mL (ref 0.0–100.0)

## 2020-08-19 LAB — C-REACTIVE PROTEIN: CRP: 13.1 mg/dL — ABNORMAL HIGH (ref <1.0)

## 2020-08-19 LAB — URINALYSIS, ROUTINE W REFLEX MICROSCOPIC
Bilirubin Urine: NEGATIVE
Glucose, UA: NEGATIVE mg/dL
Hgb urine dipstick: NEGATIVE
Ketones, ur: 80 mg/dL — AB
Nitrite: NEGATIVE
Protein, ur: 30 mg/dL — AB
Specific Gravity, Urine: 1.012 (ref 1.005–1.030)
pH: 7 (ref 5.0–8.0)

## 2020-08-19 LAB — CBC WITH DIFFERENTIAL/PLATELET
Abs Immature Granulocytes: 0.06 10*3/uL (ref 0.00–0.07)
Basophils Absolute: 0 10*3/uL (ref 0.0–0.1)
Basophils Relative: 0 %
Eosinophils Absolute: 0 10*3/uL (ref 0.0–0.5)
Eosinophils Relative: 0 %
HCT: 33.7 % — ABNORMAL LOW (ref 36.0–46.0)
Hemoglobin: 11 g/dL — ABNORMAL LOW (ref 12.0–15.0)
Immature Granulocytes: 1 %
Lymphocytes Relative: 9 %
Lymphs Abs: 0.5 10*3/uL — ABNORMAL LOW (ref 0.7–4.0)
MCH: 27.2 pg (ref 26.0–34.0)
MCHC: 32.6 g/dL (ref 30.0–36.0)
MCV: 83.4 fL (ref 80.0–100.0)
Monocytes Absolute: 0.3 10*3/uL (ref 0.1–1.0)
Monocytes Relative: 4 %
Neutro Abs: 5.2 10*3/uL (ref 1.7–7.7)
Neutrophils Relative %: 86 %
Platelets: 186 10*3/uL (ref 150–400)
RBC: 4.04 MIL/uL (ref 3.87–5.11)
RDW: 14.2 % (ref 11.5–15.5)
WBC: 6.1 10*3/uL (ref 4.0–10.5)
nRBC: 0 % (ref 0.0–0.2)

## 2020-08-19 LAB — D-DIMER, QUANTITATIVE: D-Dimer, Quant: 0.8 ug/mL-FEU — ABNORMAL HIGH (ref 0.00–0.50)

## 2020-08-19 LAB — COMPREHENSIVE METABOLIC PANEL
ALT: 71 U/L — ABNORMAL HIGH (ref 0–44)
AST: 71 U/L — ABNORMAL HIGH (ref 15–41)
Albumin: 2.6 g/dL — ABNORMAL LOW (ref 3.5–5.0)
Alkaline Phosphatase: 109 U/L (ref 38–126)
Anion gap: 13 (ref 5–15)
BUN: <5 mg/dL — ABNORMAL LOW (ref 6–20)
CO2: 23 mmol/L (ref 22–32)
Calcium: 8.1 mg/dL — ABNORMAL LOW (ref 8.9–10.3)
Chloride: 98 mmol/L (ref 98–111)
Creatinine, Ser: 0.47 mg/dL (ref 0.44–1.00)
GFR, Estimated: >60 mL/min (ref >60)
Glucose, Bld: 114 mg/dL — ABNORMAL HIGH (ref 70–99)
Potassium: 2.8 mmol/L — ABNORMAL LOW (ref 3.5–5.1)
Sodium: 134 mmol/L — ABNORMAL LOW (ref 135–145)
Total Bilirubin: 1 mg/dL (ref 0.3–1.2)
Total Protein: 6.5 g/dL (ref 6.5–8.1)

## 2020-08-19 LAB — TROPONIN I (HIGH SENSITIVITY)
Troponin I (High Sensitivity): 7 ng/L (ref <18)
Troponin I (High Sensitivity): 7 ng/L (ref <18)

## 2020-08-19 LAB — FERRITIN: Ferritin: 313 ng/mL — ABNORMAL HIGH (ref 11–307)

## 2020-08-19 LAB — MAGNESIUM: Magnesium: 1.3 mg/dL — ABNORMAL LOW (ref 1.7–2.4)

## 2020-08-19 LAB — HIV ANTIBODY (ROUTINE TESTING W REFLEX): HIV Screen 4th Generation wRfx: NONREACTIVE

## 2020-08-19 LAB — FIBRINOGEN: Fibrinogen: 690 mg/dL — ABNORMAL HIGH (ref 210–475)

## 2020-08-19 LAB — LACTIC ACID, PLASMA: Lactic Acid, Venous: 1.1 mmol/L (ref 0.5–1.9)

## 2020-08-19 MED ORDER — ENOXAPARIN SODIUM 40 MG/0.4ML ~~LOC~~ SOLN
40.0000 mg | SUBCUTANEOUS | Status: DC
  Administered 2020-08-20 – 2020-08-21 (×3): 40 mg via SUBCUTANEOUS
  Filled 2020-08-19 (×3): qty 0.4

## 2020-08-19 MED ORDER — IOHEXOL 350 MG/ML SOLN
75.0000 mL | Freq: Once | INTRAVENOUS | Status: AC | PRN
  Administered 2020-08-19: 75 mL via INTRAVENOUS

## 2020-08-19 MED ORDER — POTASSIUM CHLORIDE 2 MEQ/ML IV SOLN
Freq: Once | INTRAVENOUS | Status: AC
  Filled 2020-08-19: qty 1000

## 2020-08-19 MED ORDER — ZOLPIDEM TARTRATE 5 MG PO TABS: 5.0000 mg | ORAL_TABLET | Freq: Every evening | ORAL | Status: DC | PRN

## 2020-08-19 MED ORDER — DOCUSATE SODIUM 100 MG PO CAPS
100.0000 mg | ORAL_CAPSULE | Freq: Every day | ORAL | Status: DC
  Administered 2020-08-19 – 2020-08-21 (×3): 100 mg via ORAL
  Filled 2020-08-19 (×4): qty 1

## 2020-08-19 MED ORDER — DEXAMETHASONE SODIUM PHOSPHATE 10 MG/ML IJ SOLN
6.0000 mg | INTRAMUSCULAR | Status: DC
  Administered 2020-08-20 (×2): 6 mg via INTRAVENOUS
  Filled 2020-08-19 (×2): qty 1

## 2020-08-19 MED ORDER — ACETAMINOPHEN 325 MG PO TABS: 650.0000 mg | ORAL_TABLET | ORAL | Status: DC | PRN

## 2020-08-19 MED ORDER — CALCIUM CARBONATE ANTACID 500 MG PO CHEW: 2.0000 | CHEWABLE_TABLET | ORAL | Status: DC | PRN

## 2020-08-19 MED ORDER — LACTATED RINGERS IV BOLUS
200.0000 mL | Freq: Once | INTRAVENOUS | Status: AC
  Administered 2020-08-19: 200 mL via INTRAVENOUS

## 2020-08-19 MED ORDER — PRENATAL MULTIVITAMIN CH
1.0000 | ORAL_TABLET | Freq: Every day | ORAL | Status: DC
  Administered 2020-08-20 – 2020-08-22 (×3): 1 via ORAL
  Filled 2020-08-19 (×3): qty 1

## 2020-08-19 MED ORDER — POTASSIUM CHLORIDE CRYS ER 20 MEQ PO TBCR
40.0000 meq | EXTENDED_RELEASE_TABLET | Freq: Four times a day (QID) | ORAL | Status: AC
  Administered 2020-08-20 (×2): 40 meq via ORAL
  Filled 2020-08-19 (×2): qty 2

## 2020-08-19 MED ORDER — SODIUM CHLORIDE 0.9 % IV SOLN
200.0000 mg | Freq: Once | INTRAVENOUS | Status: AC
  Administered 2020-08-20: 200 mg via INTRAVENOUS
  Filled 2020-08-19: qty 40

## 2020-08-19 MED ORDER — GUAIFENESIN-DM 100-10 MG/5ML PO SYRP
5.0000 mL | ORAL_SOLUTION | ORAL | Status: DC | PRN
  Administered 2020-08-20 (×2): 5 mL via ORAL
  Filled 2020-08-19 (×2): qty 5

## 2020-08-19 MED ORDER — SODIUM CHLORIDE 0.9 % IV SOLN
100.0000 mg | Freq: Every day | INTRAVENOUS | Status: DC
  Administered 2020-08-20 – 2020-08-22 (×3): 100 mg via INTRAVENOUS
  Filled 2020-08-19: qty 2.78
  Filled 2020-08-19: qty 20
  Filled 2020-08-19: qty 100

## 2020-08-19 NOTE — Consult Note (Addendum)
[facility] Medical Consultation  Haley Rodgers EGB:[id number] DOB: August 04, 1986 DOA: 08/19/2020 PCP: Patient, No Pcp Per   Requesting physician: Dr. Allyn Kenner Date of consultation: August 19, 2020 Reason for consultation: Covid infection  Impression/Recommendations Active Problems:   COVID-19   Pneumonia due to COVID-19 virus  COVID-19 viral multifocal pneumonia: Patient had a SARS-CoV-2 PCR test done at [facility] on 12/12 which was positive.  Endorsing cough and pleuritic chest pain. CT angiogram chest evaluation limited due to suboptimal contrast bolus timing but negative for central PE.  Showing hazy multifocal airspace opacities at the lung bases bilaterally concerning for multifocal pneumonia.  Currently not tachypneic or hypoxic.  Bacterial pneumonia less likely given no leukocytosis on labs.  Inflammatory markers elevated: CRP 13.1, ferritin 313, D-dimer 0.80, fibrinogen 690.  -Start remdesivir -Although patient is not hypoxic at present, she is at risk for progression to severe disease given evidence of multifocal pneumonia on imaging and elevated inflammatory markers (CRP 13.1).  As such, start IV Decadron 6 mg daily. -Robitussin-DM as needed for cough -Check procalcitonin level -Check lactic acid level -Daily CBC with differential, CMP, CRP, D-dimer -Airborne and contact precautions -Incentive spirometry, flutter valve -Continuous pulse ox -Supplemental oxygen as needed to keep oxygen saturation above 90% -Blood culture x2 ordered  Sinus tachycardia: Tachycardic with heart 100-110s.  CT angiogram chest evaluation limited due to suboptimal contrast bolus timing but negative for central PE. -Bilateral lower extremity Dopplers ordered to rule out DVT given elevated D-dimer and increased risk of thromboembolism with COVID-19 infection.  Check TSH level.  Chest pain: Appears to be pleuritic but CT angiogram negative for central PE.  EKG showing nonspecific ST  abnormalities but no prior tracing available for comparison.  High-sensitivity troponin negative x2 making ACS less likely.  Patient appears comfortable at present and not complaining of chest pain. -Continue cardiac monitoring  Hypokalemia: Likely due to poor oral intake in the setting of acute viral illness. -Replete potassium.  Check magnesium level and replete if low.  Continue to monitor electrolytes.  Mildly elevated transaminases: Likely due to COVID-19 viral infection.  AST 71 and ALT 71.  Alk phos and T bili within normal range. -Monitor LFTs  Abnormal urinalysis: Patient is not endorsing any UTI symptoms.  No fever or leukocytosis.  UA (not clean-catch) with negative nitrite, moderate amount of leukocytes, 21-50 WBCs, and rare bacteria. -Stat repeat UA with urine culture  Breast nodule: CT showing a 5 mm nodule in the left breast. -Patient will need outpatient mammography for further work-up.  Pregnancy ([redacted]w[redacted]d) -Management per primary team  DVT prophylaxis: Lovenox  No family at bedside.  Diagnostic findings and treatment plan have been discussed with the patient in detail and she has given consent to start the treatment plan mentioned above.  TR[facility] will followup again tomorrow. Please contact me if I can be of assistance in the meanwhile. Thank you for this consultation.    Chief Complaint: Cough  HPI: Patient is a 34 year old [redacted]w[redacted]d G5P1031 presenting to the [facility] hospital ED today with complaints of cough, chest pain, and shortness of breath.  She recently tested positive for COVID-19 on 08/11/2020.  She was offered monoclonal antibody treatment which she had refused.  She has not been vaccinated against Covid.  Patient states her symptoms started as a cold and initially it was stuffy nose.  She did a home test for Covid which was positive on 12/11 and the next day went to [facility] to be tested and the PCR  test also came back positive.  Patient did show me an image on her  phone of positive SARS-CoV-2 PCR test done on 12/12.  States for the past 2 days she is coughing a lot and the cough is productive of clear phlegm.  Also endorsing substernal chest pain only when taking deep breaths but not otherwise.  Denies shortness of breath.  She has had low-grade fevers and chills.  Endorsing poor oral intake.  She has vomited a little after coughing aggressively but has not vomited otherwise.  No nausea or abdominal pain.  Her urine has been dark and she thinks she is dehydrated.  Denies dysuria.  ED course: Tachycardic with heart rate up to 110s and tachypneic with respiratory rate in the 20s.  SPO2 96-97% on room air.  Afebrile and normotensive.  Labs showing WBC 6.1 (low absolute lymphocyte count), hemoglobin 11.0 (at baseline), platelet 186K.  Sodium 134, potassium 2.8, chloride 98, bicarb 23, BUN <5, creatinine 0.4, glucose 114.  AST 71, ALT 71, alk phos 109, T bili 1.0.  BNP within normal range.  Initial high-sensitivity troponin negative, repeat pending.  EKG showing sinus tachycardia and nonspecific ST abnormalities; no prior tracing for comparison.  Inflammatory markers elevated: CRP 13.1, ferritin 313, D-dimer 0.80, fibrinogen 690.  UA (not clean-catch) with 80 ketones, 30 protein, negative nitrite, moderate amount of leukocytes, 21-50 WBCs, and rare bacteria.  Chest x-ray showing patchy bilateral airspace disease worrisome for COVID-19 viral pneumonia.  CT angiogram chest evaluation limited due to suboptimal contrast bolus timing but negative for central PE.  Showing hazy multifocal airspace opacities at the lung bases bilaterally concerning for multifocal pneumonia.  Also showing a 5 mm nodule in the left breast.  Follow-up with outpatient mammography recommended.  Review of Systems: All systems reviewed and apart from history of present illness are negative.  Past Medical History:  Diagnosis Date  . Gestational diabetes   . Medical history non-contributory    Past  Surgical History:  Procedure Laterality Date  . CESAREAN SECTION N/A 03/01/2018   Procedure: CESAREAN SECTION;  Surgeon: Olga Millers, MD;  Location: [place];  Service: Obstetrics;  Laterality: N/A;   Social History:  reports that she has never smoked. She has never used smokeless tobacco. She reports previous alcohol use. She reports that she does not use drugs.  No Known Allergies Family History  Problem Relation Age of Onset  . Cancer Father   . Diabetes Father   . Hyperlipidemia Maternal Grandmother   . Cancer Maternal Grandfather   . Hyperlipidemia Maternal Grandfather     Prior to Admission medications   Medication Sig Start Date End Date Taking? Authorizing Provider  acetaminophen (TYLENOL) 325 MG tablet Take 650 mg by mouth every 6 (six) hours as needed for headache. Patient not taking: Reported on 06/29/2020    [provider]  aspirin EC 81 MG tablet Take 81 mg by mouth daily. Swallow whole.    [provider]  calcium carbonate (TUMS - DOSED IN MG ELEMENTAL CALCIUM) 500 MG chewable tablet Chew 1 tablet by mouth 2 (two) times daily as needed for indigestion or heartburn.    [provider]  hydrocortisone 2.5 % ointment hydrocortisone 2.5 % topical ointment  APPLY A THIN LAYER TO THE AFFECTED AREA BY TOPICAL ROUTE 2 TIMES PER DAY Patient not taking: Reported on 06/29/2020    [provider]  oxyCODONE-acetaminophen (PERCOCET/ROXICET) 5-325 MG tablet Take 1 tablet by mouth every 4 (four) hours as  needed (pain scale 4-7). 03/05/18   Bobbye Charleston, MD  potassium chloride (K-DUR) 10 MEQ tablet Take 4 tablets (40 mEq total) by mouth 2 (two) times daily. 02/24/18   Darlina Rumpf, CNM  Prenatal Vit-Fe Fumarate-FA (PRENATAL MULTIVITAMIN) TABS tablet Take 1 tablet by mouth daily at 12 noon.    [provider]   Physical Exam: Blood pressure 115/68, pulse (!) 107, temperature 98.3 F (36.8 C), resp. rate 18, last  menstrual period 03/18/2020, SpO2 96 %, unknown if currently breastfeeding. Vitals:   08/19/20 1812 08/19/20 2114  BP: 126/77 115/68  Pulse: (!) 112 (!) 107  Resp: 18   Temp: 98.3 F (36.8 C)   SpO2: 96%    Physical Exam Constitutional:      General: She is not in acute distress. HENT:     Head: Normocephalic and atraumatic.  Eyes:     Extraocular Movements: Extraocular movements intact.     Conjunctiva/sclera: Conjunctivae normal.  Cardiovascular:     Rate and Rhythm: Normal rate and regular rhythm.     Pulses: Normal pulses.  Pulmonary:     Effort: Pulmonary effort is normal. No respiratory distress.     Breath sounds: Rales present. No wheezing.  Abdominal:     General: Bowel sounds are normal.     Palpations: Abdomen is soft.     Tenderness: There is no abdominal tenderness. There is no guarding.  Musculoskeletal:        General: No swelling or tenderness.     Cervical back: Normal range of motion and neck supple.  Skin:    General: Skin is warm and dry.  Neurological:     General: No focal deficit present.     Mental Status: She is alert and oriented to person, place, and time.     Labs on Admission:  Basic Metabolic Panel: Recent Labs  Lab 08/19/20 1850  NA 134*  K 2.8*  CL 98  CO2 23  GLUCOSE 114*  BUN <5*  CREATININE 0.47  CALCIUM 8.1*   Liver Function Tests: Recent Labs  Lab 08/19/20 1850  AST 71*  ALT 71*  ALKPHOS 109  BILITOT 1.0  PROT 6.5  ALBUMIN 2.6*   No results for input(s): LIPASE, AMYLASE in the last 168 hours. No results for input(s): AMMONIA in the last 168 hours. CBC: Recent Labs  Lab 08/19/20 1850  WBC 6.1  NEUTROABS 5.2  HGB 11.0*  HCT 33.7*  MCV 83.4  PLT 186   Cardiac Enzymes: No results for input(s): CKTOTAL, CKMB, CKMBINDEX, TROPONINI in the last 168 hours. BNP: Invalid input(s): POCBNP CBG: No results for input(s): GLUCAP in the last 168 hours.  Radiological Exams on Admission: CT ANGIO CHEST PE W OR WO  CONTRAST  Result Date: 08/19/2020 CLINICAL DATA:  PE suspected.  Pregnant.  Worsening cough. EXAM: CT ANGIOGRAPHY CHEST WITH CONTRAST TECHNIQUE: Multidetector CT imaging of the chest was performed using the standard protocol during bolus administration of intravenous contrast. Multiplanar CT image reconstructions and MIPs were obtained to evaluate the vascular anatomy. CONTRAST:  23mL OMNIPAQUE IOHEXOL 350 MG/ML SOLN COMPARISON:  None. FINDINGS: Cardiovascular: Evaluation for pulmonary emboli is limited by suboptimal contrast bolus timing. Within that limitation, there is no central pulmonary embolus. The size of the main pulmonary artery is normal. Heart size is normal, with no pericardial effusion. The course and caliber of the aorta are normal. There is no atherosclerotic calcification. Opacification decreased due to pulmonary arterial phase contrast bolus timing. Mediastinum/Nodes: --  No mediastinal lymphadenopathy. -- No hilar lymphadenopathy. -- No axillary lymphadenopathy. -- No supraclavicular lymphadenopathy. -- Normal thyroid gland where visualized. -  Unremarkable esophagus. Lungs/Pleura: There are hazy multifocal airspace opacities at the lung bases bilaterally. There is no pneumothorax. There is no large pleural effusion. Upper Abdomen: Contrast bolus timing is not optimized for evaluation of the abdominal organs. The visualized portions of the organs of the upper abdomen are normal. Musculoskeletal: No chest wall abnormality. No bony spinal canal stenosis. There is a small nodule in the left breast measuring approximately 5 mm (axial series 5, image 26). Review of the MIP images confirms the above findings. IMPRESSION: 1. Evaluation for pulmonary emboli is limited by suboptimal contrast bolus timing. Within that limitation, there is no central pulmonary embolus. 2. Hazy multifocal airspace opacities at the lung bases bilaterally, concerning for multifocal pneumonia (viral or bacterial). 3. Small 5  mm nodule in the left breast. Follow-up with outpatient mammography is recommended. Electronically Signed   By: Constance Holster M.D.   On: 08/19/2020 20:48   DG CHEST PORT 1 VIEW  Result Date: 08/19/2020 CLINICAL DATA:  Shortness of breath, pregnant EXAM: PORTABLE CHEST 1 VIEW COMPARISON:  None. FINDINGS: Single frontal view of the chest demonstrates an unremarkable cardiac silhouette. There are patchy bilateral areas of airspace disease greatest at the left lung base. No effusion or pneumothorax. No acute bony abnormalities. IMPRESSION: 1. Patchy bibasilar airspace disease, favor multifocal pneumonia over edema. Pattern is consistent with COVID-19. Electronically Signed   By: Randa Ngo M.D.   On: 08/19/2020 19:17    EKG: Independently reviewed.  Sinus tachycardia and nonspecific ST abnormalities.  No prior tracing for comparison.  Time spent: 60 minutes.  Shela Leff [facility]  If 7PM-7AM, please contact night-coverage [email or web address]  08/19/2020, 9:15 PM

## 2020-08-19 NOTE — H&P (Addendum)
34 y.o. [redacted]w[redacted]d  [id number] comes in c/o cough with associated CP and SOB.  She reports covid 19+ as of 12/12.  She was offered monoclonal antibody treatment which she refused.  She has not been vaccinated for covid19.  Initially with mild symptoms, has now developed a cough.  No obstetric complaints.  Went to urgent care and was diagnosed with presumptive pneumonia and tachycardia, was told to come to MAU for DVT eval.  Past Medical History:  Diagnosis Date  . Gestational diabetes   . Medical history non-contributory     Past Surgical History:  Procedure Laterality Date  . CESAREAN SECTION N/A 03/01/2018   Procedure: CESAREAN SECTION;  Surgeon: Olga Millers, MD;  Location: [place];  Service: Obstetrics;  Laterality: N/A;    OB History  Gravida Para Term Preterm AB Living  5 1 1   3 1   SAB IAB Ectopic Multiple Live Births  3            # Outcome Date GA Lbr Len/2nd Weight Sex Delivery Anes PTL Lv  5 Current           4 SAB 2017          3 SAB           2 SAB           1 Term             Social History   Socioeconomic History  . Marital status: Married    Spouse name: Not on file  . Number of children: Not on file  . Years of education: Not on file  . Highest education level: Not on file  Occupational History  . Not on file  Tobacco Use  . Smoking status: Never Smoker  . Smokeless tobacco: Never Used  Vaping Use  . Vaping Use: Never used  Substance and Sexual Activity  . Alcohol use: Not Currently  . Drug use: Never  . Sexual activity: Yes    Birth control/protection: None  Other Topics Concern  . Not on file  Social History Narrative  . Not on file   Social Determinants of Health   Financial Resource Strain: Not on file  Food Insecurity: Not on file  Transportation Needs: Not on file  Physical Activity: Not on file  Stress: Not on file  Social Connections: Not on file  Intimate Partner Violence: Not on file   Patient has no known allergies.   Other  PNC: IVF pregnancy, AMA, history of cesarean section, megacystitis found on fetal ultrasound- referred to MFM, h.o gestational hypertension   Vitals:   08/19/20 1812  BP: 126/77  Pulse: (!) 112  Resp: 18  Temp: 98.3 F (36.8 C)  SpO2: 96%   Physical exam per MAU staff, please see note.  Spoke with patient by phone to discuss results, admission and further eval and mgmt by hospitalist.  FHTs: present by doppler 159  A/P   Admit for 23 hr obs at 22weeks with covid19 and pneumonia Hospitalist stat consult placed, discussed CXR, electrolyte abnormalities, elevated LFTs, refusal of monoclonal antibodies prior this week, not vaccinated for covid 19.  Now interested in monoclonal antibody treatment if available.  Pt currently in CT for eval of tachycardia.  O2 96% RA SCDs.  Hospitalist to place additional orders. Fetal HR by doppler -repeat q1-3d   Allyn Kenner

## 2020-08-19 NOTE — MAU Note (Signed)
Pt reports to mau reporting pos covid test on 08/11/20. Pt states she had mild symptoms initially but developed a cough Friday night that has worsened.  Pt states she was seen at urgent care and was told she had pneumonia.  States provider gave her shot of rocephin and rx for z pack. Pt was advised by urgent care to come to mau to check for blood clot.  Pt denies bleeding, cramping or any other ob complaints.

## 2020-08-19 NOTE — MAU Provider Note (Signed)
History     CSN: [id number]  Arrival date and time: 08/19/20 1746   Event Date/Time   First Provider Initiated Contact with Patient 08/19/20 1820      Chief Complaint  Patient presents with  . Covid Positive  . Cough   HPI  [name] is a 34 y.o. female [phone] @ [redacted]w[redacted]d here in MAU with a cough. She reports testing positive with Covid 19 one week ago. She reports her cough got worse Friday night and she devoloped some mild chest pain only when she coughs. She also reports mild SOB with cough only. She was seen today at urgent care and was diagnosed with pneumonia. She was given antibiotics and was told to come to MAU to evaluated for blood clot d/t tachycardia. She overall feels ok. Reports that she does feel worse this weekend compared to last week.  The highest her temp reached was 100.7.   Was offered monoclonial antibodies this week and refused. She is unvaccinated. Her husband and little boy tested positive for Covid this week.    OB History    Gravida  5   Para  1   Term  1   Preterm      AB  3   Living  1     SAB  3   IAB      Ectopic      Multiple      Live Births              Past Medical History:  Diagnosis Date  . Gestational diabetes   . Medical history non-contributory     Past Surgical History:  Procedure Laterality Date  . CESAREAN SECTION N/A 03/01/2018   Procedure: CESAREAN SECTION;  Surgeon: Olga Millers, MD;  Location: [facility];  Service: Obstetrics;  Laterality: N/A;    Family History  Problem Relation Age of Onset  . Cancer Father   . Diabetes Father   . Hyperlipidemia Maternal Grandmother   . Cancer Maternal Grandfather   . Hyperlipidemia Maternal Grandfather     Social History   Tobacco Use  . Smoking status: Never Smoker  . Smokeless tobacco: Never Used  Vaping Use  . Vaping Use: Never used  Substance Use Topics  . Alcohol use: Not Currently  . Drug use: Never    Allergies: No Known  Allergies  Medications Prior to Admission  Medication Sig Dispense Refill Last Dose  . acetaminophen (TYLENOL) 325 MG tablet Take 650 mg by mouth every 6 (six) hours as needed for headache. (Patient not taking: Reported on 06/29/2020)     . aspirin EC 81 MG tablet Take 81 mg by mouth daily. Swallow whole.     . calcium carbonate (TUMS - DOSED IN MG ELEMENTAL CALCIUM) 500 MG chewable tablet Chew 1 tablet by mouth 2 (two) times daily as needed for indigestion or heartburn.     . hydrocortisone 2.5 % ointment hydrocortisone 2.5 % topical ointment  APPLY A THIN LAYER TO THE AFFECTED AREA BY TOPICAL ROUTE 2 TIMES PER DAY (Patient not taking: Reported on 06/29/2020)     . oxyCODONE-acetaminophen (PERCOCET/ROXICET) 5-325 MG tablet Take 1 tablet by mouth every 4 (four) hours as needed (pain scale 4-7). 30 tablet 0   . potassium chloride (K-DUR) 10 MEQ tablet Take 4 tablets (40 mEq total) by mouth 2 (two) times daily. 24 tablet 0   . Prenatal Vit-Fe Fumarate-FA (PRENATAL MULTIVITAMIN) TABS tablet Take 1 tablet by mouth daily  at 12 noon.      Results for orders placed or performed during the hospital encounter of 08/19/20 (from the past 48 hour(s))  Urinalysis, Routine w reflex microscopic Urine, Clean Catch     Status: Abnormal   Collection Time: 08/19/20  6:18 PM  Result Value Ref Range   Color, Urine AMBER (A) YELLOW    Comment: BIOCHEMICALS MAY BE AFFECTED BY COLOR   APPearance CLOUDY (A) CLEAR   Specific Gravity, Urine 1.012 1.005 - 1.030   pH 7.0 5.0 - 8.0   Glucose, UA NEGATIVE NEGATIVE mg/dL   Hgb urine dipstick NEGATIVE NEGATIVE   Bilirubin Urine NEGATIVE NEGATIVE   Ketones, ur 80 (A) NEGATIVE mg/dL   Protein, ur 30 (A) NEGATIVE mg/dL   Nitrite NEGATIVE NEGATIVE   Leukocytes,Ua MODERATE (A) NEGATIVE   RBC / HPF 0-5 0 - 5 RBC/hpf   WBC, UA 21-50 0 - 5 WBC/hpf   Bacteria, UA RARE (A) NONE SEEN   Squamous Epithelial / LPF 21-50 0 - 5   Mucus PRESENT     Comment: Performed at [facility], [address] [place], [place] [address]  Brain natriuretic peptide     Status: None   Collection Time: 08/19/20  6:42 PM  Result Value Ref Range   B Natriuretic Peptide 39.8 0.0 - 100.0 pg/mL    Comment: Performed at [facility] [address], [place], [place] [address]  CBC with Differential/Platelet     Status: Abnormal   Collection Time: 08/19/20  6:50 PM  Result Value Ref Range   WBC 6.1 4.0 - 10.5 K/uL   RBC 4.04 3.87 - 5.11 MIL/uL   Hemoglobin 11.0 (L) 12.0 - 15.0 g/dL   HCT 33.7 (L) 36.0 - 46.0 %   MCV 83.4 80.0 - 100.0 fL   MCH 27.2 26.0 - 34.0 pg   MCHC 32.6 30.0 - 36.0 g/dL   RDW 14.2 11.5 - 15.5 %   Platelets 186 150 - 400 K/uL   nRBC 0.0 0.0 - 0.2 %   Neutrophils Relative % 86 %   Neutro Abs 5.2 1.7 - 7.7 K/uL   Lymphocytes Relative 9 %   Lymphs Abs 0.5 (L) 0.7 - 4.0 K/uL   Monocytes Relative 4 %   Monocytes Absolute 0.3 0.1 - 1.0 K/uL   Eosinophils Relative 0 %   Eosinophils Absolute 0.0 0.0 - 0.5 K/uL   Basophils Relative 0 %   Basophils Absolute 0.0 0.0 - 0.1 K/uL   Immature Granulocytes 1 %   Abs Immature Granulocytes 0.06 0.00 - 0.07 K/uL    Comment: Performed at [facility] [address], [place], [place] [address]  Comprehensive metabolic panel     Status: Abnormal   Collection Time: 08/19/20  6:50 PM  Result Value Ref Range   Sodium 134 (L) 135 - 145 mmol/L   Potassium 2.8 (L) 3.5 - 5.1 mmol/L   Chloride 98 98 - 111 mmol/L   CO2 23 22 - 32 mmol/L   Glucose, Bld 114 (H) 70 - 99 mg/dL    Comment: Glucose reference range applies only to samples taken after fasting for at least 8 hours.   BUN <5 (L) 6 - 20 mg/dL   Creatinine, Ser 0.47 0.44 - 1.00 mg/dL   Calcium 8.1 (L) 8.9 - 10.3 mg/dL   Total Protein 6.5 6.5 - 8.1 g/dL   Albumin 2.6 (L) 3.5 - 5.0 g/dL   AST 71 (H)  15 - 41 U/L   ALT 71 (H) 0 - 44 U/L   Alkaline Phosphatase 109 38 - 126 U/L   Total Bilirubin 1.0 0.3 - 1.2 mg/dL   GFR, Estimated >60 >60 mL/min    Comment:  (NOTE) Calculated using the CKD-EPI Creatinine Equation (2021)    Anion gap 13 5 - 15    Comment: Performed at [facility] [address], [place], [place] [address]  Troponin I (High Sensitivity)     Status: None   Collection Time: 08/19/20  6:50 PM  Result Value Ref Range   Troponin I (High Sensitivity) 7 <18 ng/L    Comment: (NOTE) Elevated high sensitivity troponin I (hsTnI) values and significant  changes across serial measurements may suggest ACS but many other  chronic and acute conditions are known to elevate hsTnI results.  Refer to the Links section for chest pain algorithms and additional  guidance. Performed at [facility], [place] [address], [place], [place] [address]   C-reactive protein     Status: Abnormal   Collection Time: 08/19/20  6:50 PM  Result Value Ref Range   CRP 13.1 (H) <1.0 mg/dL    Comment: Performed at [facility] [address], [place], [place] [address]  Ferritin     Status: Abnormal   Collection Time: 08/19/20  6:50 PM  Result Value Ref Range   Ferritin 313 (H) 11 - 307 ng/mL    Comment: Performed at [facility], [place] [address] [place], [place] [address]  D-dimer, quantitative (not at [facility])     Status: Abnormal   Collection Time: 08/19/20  7:26 PM  Result Value Ref Range   D-Dimer, Quant 0.80 (H) 0.00 - 0.50 ug/mL-FEU    Comment: (NOTE) At the manufacturer cut-off value of 0.5 g/mL FEU, this assay has a negative predictive value of 95-100%.This assay is intended for use in conjunction with a clinical pretest probability (PTP) assessment model to exclude pulmonary embolism (PE) and deep venous thrombosis (DVT) in outpatients suspected of PE or DVT. Results should be correlated with clinical presentation. Performed at [facility], [place] [address], [place], [place] [address]   Fibrinogen     Status: Abnormal   Collection Time: 08/19/20  7:26 PM  Result Value Ref Range   Fibrinogen 690 (H) 210 - 475 mg/dL     Comment: Performed at [facility] [address] [place], [place] [address]   CT ANGIO CHEST PE W OR WO CONTRAST  Result Date: 08/19/2020 CLINICAL DATA:  PE suspected.  Pregnant.  Worsening cough. EXAM: CT ANGIOGRAPHY CHEST WITH CONTRAST TECHNIQUE: Multidetector CT imaging of the chest was performed using the standard protocol during bolus administration of intravenous contrast. Multiplanar CT image reconstructions and MIPs were obtained to evaluate the vascular anatomy. CONTRAST:  37mL OMNIPAQUE IOHEXOL 350 MG/ML SOLN COMPARISON:  None. FINDINGS: Cardiovascular: Evaluation for pulmonary emboli is limited by suboptimal contrast bolus timing. Within that limitation, there is no central pulmonary embolus. The size of the main pulmonary artery is normal. Heart size is normal, with no pericardial effusion. The course and caliber of the aorta are normal. There is no atherosclerotic calcification. Opacification decreased due to pulmonary arterial phase contrast bolus timing. Mediastinum/Nodes: -- No mediastinal lymphadenopathy. -- No hilar lymphadenopathy. -- No axillary lymphadenopathy. -- No supraclavicular lymphadenopathy. -- Normal thyroid gland where visualized. -  Unremarkable esophagus. Lungs/Pleura: There are hazy multifocal airspace opacities at the lung bases  bilaterally. There is no pneumothorax. There is no large pleural effusion. Upper Abdomen: Contrast bolus timing is not optimized for evaluation of the abdominal organs. The visualized portions of the organs of the upper abdomen are normal. Musculoskeletal: No chest wall abnormality. No bony spinal canal stenosis. There is a small nodule in the left breast measuring approximately 5 mm (axial series 5, image 26). Review of the MIP images confirms the above findings. IMPRESSION: 1. Evaluation for pulmonary emboli is limited by suboptimal contrast bolus timing. Within that limitation, there is no central pulmonary embolus. 2. Hazy multifocal  airspace opacities at the lung bases bilaterally, concerning for multifocal pneumonia (viral or bacterial). 3. Small 5 mm nodule in the left breast. Follow-up with outpatient mammography is recommended. Electronically Signed   By: Constance Holster M.D.   On: 08/19/2020 20:48   DG CHEST PORT 1 VIEW  Result Date: 08/19/2020 CLINICAL DATA:  Shortness of breath, pregnant EXAM: PORTABLE CHEST 1 VIEW COMPARISON:  None. FINDINGS: Single frontal view of the chest demonstrates an unremarkable cardiac silhouette. There are patchy bilateral areas of airspace disease greatest at the left lung base. No effusion or pneumothorax. No acute bony abnormalities. IMPRESSION: 1. Patchy bibasilar airspace disease, favor multifocal pneumonia over edema. Pattern is consistent with COVID-19. Electronically Signed   By: Randa Ngo M.D.   On: 08/19/2020 19:17    Review of Systems  Constitutional: Positive for chills, fatigue and fever.  Respiratory: Positive for cough and shortness of breath.   Gastrointestinal: Negative for abdominal pain.  Genitourinary: Negative for flank pain, vaginal bleeding and vaginal discharge.   Physical Exam   Blood pressure 126/77, pulse (!) 112, temperature 98.3 F (36.8 C), resp. rate 18, last menstrual period 03/18/2020, SpO2 96 %, unknown if currently breastfeeding.  Physical Exam Constitutional:      General: She is in acute distress.     Appearance: She is ill-appearing.  HENT:     Head: Normocephalic.  Eyes:     Pupils: Pupils are equal, round, and reactive to light.  Chest:     Chest wall: Tenderness present.  Musculoskeletal:        General: Normal range of motion.     Cervical back: Neck supple.  Skin:    General: Skin is warm.  Neurological:     Mental Status: She is alert and oriented to person, place, and time.  Psychiatric:        Behavior: Behavior normal.    MAU Course  Procedures  None  MDM  C-Xray  CT scan  Troponin, CMP, CBC with diff, BNP,  UA, fibrinogen, D-Dimer, Ferritin, C-reactive protein Discussed patient with Dr. Rogue Bussing  Pulse ox 96% on RA  Assessment and Plan   A:  1. Pneumonia due to COVID-19 virus   2. Cough   3. COVID-19   4. Shortness of breath     P:  Ad[facility] to [facility] patient  40 meq of KDUR in [place] CT results pending.  Lezlie Lye, NP 08/19/2020 8:56 PM

## 2020-08-20 ENCOUNTER — Inpatient Hospital Stay (HOSPITAL_COMMUNITY): Payer: 59

## 2020-08-20 DIAGNOSIS — R7989 Other specified abnormal findings of blood chemistry: Secondary | ICD-10-CM | POA: Diagnosis not present

## 2020-08-20 DIAGNOSIS — U071 COVID-19: Secondary | ICD-10-CM | POA: Diagnosis present

## 2020-08-20 DIAGNOSIS — E876 Hypokalemia: Secondary | ICD-10-CM

## 2020-08-20 DIAGNOSIS — O98512 Other viral diseases complicating pregnancy, second trimester: Secondary | ICD-10-CM | POA: Diagnosis present

## 2020-08-20 LAB — COMPREHENSIVE METABOLIC PANEL
ALT: 79 U/L — ABNORMAL HIGH (ref 0–44)
AST: 80 U/L — ABNORMAL HIGH (ref 15–41)
Albumin: 2.2 g/dL — ABNORMAL LOW (ref 3.5–5.0)
Alkaline Phosphatase: 90 U/L (ref 38–126)
Anion gap: 10 (ref 5–15)
BUN: <5 mg/dL — ABNORMAL LOW (ref 6–20)
CO2: 22 mmol/L (ref 22–32)
Calcium: 7.7 mg/dL — ABNORMAL LOW (ref 8.9–10.3)
Chloride: 101 mmol/L (ref 98–111)
Creatinine, Ser: 0.42 mg/dL — ABNORMAL LOW (ref 0.44–1.00)
GFR, Estimated: >60 mL/min (ref >60)
Glucose, Bld: 144 mg/dL — ABNORMAL HIGH (ref 70–99)
Potassium: 3.2 mmol/L — ABNORMAL LOW (ref 3.5–5.1)
Sodium: 133 mmol/L — ABNORMAL LOW (ref 135–145)
Total Bilirubin: 0.9 mg/dL (ref 0.3–1.2)
Total Protein: 6 g/dL — ABNORMAL LOW (ref 6.5–8.1)

## 2020-08-20 LAB — CBC WITH DIFFERENTIAL/PLATELET
Abs Immature Granulocytes: 0.08 10*3/uL — ABNORMAL HIGH (ref 0.00–0.07)
Basophils Absolute: 0 10*3/uL (ref 0.0–0.1)
Basophils Relative: 0 %
Eosinophils Absolute: 0 10*3/uL (ref 0.0–0.5)
Eosinophils Relative: 0 %
HCT: 29.7 % — ABNORMAL LOW (ref 36.0–46.0)
Hemoglobin: 9.9 g/dL — ABNORMAL LOW (ref 12.0–15.0)
Immature Granulocytes: 2 %
Lymphocytes Relative: 8 %
Lymphs Abs: 0.4 10*3/uL — ABNORMAL LOW (ref 0.7–4.0)
MCH: 27.6 pg (ref 26.0–34.0)
MCHC: 33.3 g/dL (ref 30.0–36.0)
MCV: 82.7 fL (ref 80.0–100.0)
Monocytes Absolute: 0.2 10*3/uL (ref 0.1–1.0)
Monocytes Relative: 4 %
Neutro Abs: 4.5 10*3/uL (ref 1.7–7.7)
Neutrophils Relative %: 86 %
Platelets: 178 10*3/uL (ref 150–400)
RBC: 3.59 MIL/uL — ABNORMAL LOW (ref 3.87–5.11)
RDW: 14.2 % (ref 11.5–15.5)
WBC: 5.3 10*3/uL (ref 4.0–10.5)
nRBC: 0 % (ref 0.0–0.2)

## 2020-08-20 LAB — URINALYSIS, ROUTINE W REFLEX MICROSCOPIC
Bilirubin Urine: NEGATIVE
Glucose, UA: 50 mg/dL — AB
Hgb urine dipstick: NEGATIVE
Ketones, ur: 20 mg/dL — AB
Leukocytes,Ua: NEGATIVE
Nitrite: NEGATIVE
Protein, ur: NEGATIVE mg/dL
Specific Gravity, Urine: 1.019 (ref 1.005–1.030)
pH: 7 (ref 5.0–8.0)

## 2020-08-20 LAB — PROCALCITONIN: Procalcitonin: 0.16 ng/mL

## 2020-08-20 LAB — URINE CULTURE: Culture: NO GROWTH

## 2020-08-20 LAB — D-DIMER, QUANTITATIVE: D-Dimer, Quant: 0.38 ug/mL-FEU (ref 0.00–0.50)

## 2020-08-20 LAB — C-REACTIVE PROTEIN: CRP: 12.1 mg/dL — ABNORMAL HIGH (ref <1.0)

## 2020-08-20 LAB — TSH: TSH: 0.9 u[IU]/mL (ref 0.350–4.500)

## 2020-08-20 LAB — TYPE AND SCREEN
ABO/RH(D): O NEG
Antibody Screen: NEGATIVE

## 2020-08-20 MED ORDER — IPRATROPIUM-ALBUTEROL 20-100 MCG/ACT IN AERS
1.0000 | INHALATION_SPRAY | Freq: Four times a day (QID) | RESPIRATORY_TRACT | Status: DC
  Administered 2020-08-20 – 2020-08-22 (×7): 1 via RESPIRATORY_TRACT
  Filled 2020-08-20: qty 4

## 2020-08-20 MED ORDER — ALBUTEROL SULFATE HFA 108 (90 BASE) MCG/ACT IN AERS: 1.0000 | INHALATION_SPRAY | RESPIRATORY_TRACT | Status: DC | PRN

## 2020-08-20 MED ORDER — MAGNESIUM SULFATE 2 GM/50ML IV SOLN
2.0000 g | Freq: Once | INTRAVENOUS | Status: AC
  Administered 2020-08-20: 2 g via INTRAVENOUS
  Filled 2020-08-20: qty 50

## 2020-08-20 MED ORDER — POTASSIUM CHLORIDE 20 MEQ PO PACK
40.0000 meq | PACK | ORAL | Status: AC
  Administered 2020-08-20 (×2): 40 meq via ORAL
  Filled 2020-08-20 (×2): qty 2

## 2020-08-20 NOTE — Progress Notes (Signed)
Lower extremity venous has been completed.   Preliminary results in CV Proc.   Abram Sander 08/20/2020 10:50 AM

## 2020-08-20 NOTE — Progress Notes (Signed)
Patient O2 stating 90% on RA while sleeping. 2L O2 added by nasal cannula per transcribed order to maintain O2 >92%.   Will reassess O2 need once awake.

## 2020-08-20 NOTE — Progress Notes (Addendum)
Haley Rodgers 34 y.o. [id number] at [redacted]w[redacted]d HD#2 admitted with COVID with pneumonia  S: Feeling better now that she received fluids. Cough continues. Denies SOB. Reports feeling some FM. No contractions, VB, LOF.  O: Patient Vitals for the past 24 hrs:  BP Temp Temp src Pulse Resp SpO2 Height Weight  08/20/20 1500 -- -- -- -- -- 98 % -- --  08/20/20 1350 -- -- -- -- -- 94 % -- --  08/20/20 1345 -- -- -- -- -- 94 % -- --  08/20/20 1340 -- -- -- -- -- 96 % -- --  08/20/20 1335 -- -- -- -- -- 95 % -- --  08/20/20 1330 -- -- -- -- -- 96 % -- --  08/20/20 1325 -- -- -- -- -- 98 % -- --  08/20/20 1320 -- -- -- -- -- 96 % -- --  08/20/20 1315 -- -- -- -- -- 97 % -- --  08/20/20 1310 -- -- -- -- -- 96 % -- --  08/20/20 1305 -- -- -- -- -- 96 % -- --  08/20/20 1300 -- -- -- -- -- 95 % -- --  08/20/20 1210 109/63 97.7 F (36.5 C) Oral 93 19 97 % -- --  08/20/20 1205 -- -- -- -- -- 98 % -- --  08/20/20 1200 -- -- -- -- -- 98 % -- --  08/20/20 1100 -- -- -- -- -- 97 % -- --  08/20/20 1000 -- -- -- -- -- 96 % -- --  08/20/20 0900 -- -- -- -- -- 94 % -- --  08/20/20 0830 105/64 97.7 F (36.5 C) Oral 91 20 93 % -- --  08/20/20 0800 -- -- -- -- -- 97 % -- --  08/20/20 0605 -- -- -- -- -- 96 % -- --  08/20/20 0440 115/78 97.6 F (36.4 C) Oral (!) 101 (!) 23 92 % -- --  08/20/20 0435 -- -- -- -- -- 96 % -- --  08/20/20 0310 -- -- -- -- -- 94 % -- --  08/20/20 0305 -- -- -- -- -- 90 % -- --  08/20/20 0300 -- -- -- -- -- 90 % -- --  08/20/20 0255 -- -- -- -- -- 90 % -- --  08/20/20 0250 -- -- -- -- -- 90 % -- --  08/20/20 0245 -- -- -- -- -- 93 % -- --  08/20/20 0225 -- -- -- -- -- 91 % -- --  08/20/20 0220 -- -- -- -- -- 91 % -- --  08/20/20 0215 -- -- -- -- -- 91 % -- --  08/20/20 0145 -- -- -- -- -- 93 % -- --  08/20/20 0100 -- -- -- -- -- 99 % -- --  08/20/20 0031 108/70 98.6 F (37 C) Oral (!) 105 (!) 24 97 % -- --  08/19/20 2132 -- -- -- -- -- -- 5\' 4"  (1.626 m) 76 kg  08/19/20 2114  115/68 99.6 F (37.6 C) Oral (!) 107 (!) 26 97 % -- --  08/19/20 1812 126/77 98.3 F (36.8 C) -- (!) 112 18 96 % -- --     A/p: Royston Bake Lecompte 34 y.o. [id number] at [redacted]w[redacted]d HD#2 admitted with COVID 1. COVID: tested positive 12/12, Hospitalist service managing COVID infection, appreciate their assistance.  -Patient was on 2L O2 overnight now room air.  -On remdesivir, decadron -For symptomatic support robitussin, offered tylenol prn, patient declines.  -Lower extremity dopplers prelim negative -Lovenox for  DVT prophylaxis   2. Pregnancy -IVF pregnancy -H/o GHTN, on ASA for preeclampsia prevention -Weak D phenotype, will need rhogam -Anatomy scan with megacystis. Referred to MFM. 11/3: Megacystis seen again on [place]. Declined amniocentesis. No other markers of aneuploidy observed. 11/30 MFM Anatomy---normal anatomy, no megacystis on exam. Referred for fetal ECHO due to IVF. Recommended f/u w MFM in 4 weeks to double check kidneys and bladder, scheduled 12/30, discussed possible need to reschedule for Jan pending clinical status of COVID 3. Breast lump: 14mm left breast nodule seen on CT during this admission for covid 19, recommendation is f/u mammogram as outpatient. Patient made aware of results and need for outpatient follow up.   I connected with  Haley Rodgers on 08/20/20 by phone and verified that I am speaking with the correct person using two identifiers.   I discussed the limitations of evaluation and management by telemedicine. The patient expressed understanding and agreed to proceed.  Haley Rodgers K Taam-Akelman 08/20/20 1:14 PM

## 2020-08-20 NOTE — Progress Notes (Signed)
PROGRESS NOTE    Eloni Darius Mccolm  VOZ:[id number] DOB: [birth year] DOA: 08/19/2020 PCP: Patient, No Pcp Per    Brief Narrative:  Mrs. Allerd is being consulted for SARS COVID-19 viral pneumonia.  34 year old, not vaccinated for COVID-19, on her 22th week of pregnancy tested positive for COVID-19 on 08/11/20, her symptoms were consistent with nasal congestion, fever, chills, chest pain, cough and dyspnea.  On her initial physical examination she was tachycardic 110 bpm, tachypneic 20 bpm, oxygen saturation was 96 to 97% on room. Her lungs had rales bilaterally, heart s1 and s2 present, abdomen soft, no lower extremity edema.  Sodium 134, potassium 2.8, chloride 98, bicarb 23, glucose 114, BUN less than 5 creatinine 0.47, white count 6.1, hemoglobin 11.0 hematocrit 33.7 platelets 186.  Urine analysis specific gravity 1.019.  Chest radiograph bilateral interstitial infiltrates at bases in the periphery.  CT chest with bilateral peripheral patchy opacities predominantly basal.  EKG, 106 bpm normal axis, normal intervals, sinus rhythm, no st segment or t wave changes.   Assessment & Plan:   Active Problems:   COVID-19   Pneumonia due to COVID-19 virus   1. SARS covid 19 viral pneumonia.  RR: 19  Pulse oxymetry: 97%  Fi02: 21% room air.    COVID-19 Labs  Recent Labs    08/19/20 1850 08/19/20 1926 08/20/20 0611  DDIMER  --  0.80* 0.38  FERRITIN 313*  --   --   CRP 13.1*  --  12.1*    No results found for: [place]  Patient is feeling better, decrease oxygen requirements, but continue to have elevated inflammatory markers.  Elevated liver enzymes (AST 89, ALT 79) due to acute viral disease due to COVID 19.  Continue medical therapy with dexamethasone and remdesivir, antitussive agents and airway clearing techniques.  Add bronchodilator therapy. Follow up on inflammatory markers and oxygenation.  PT/OT and out of bed to chair tid with meals.   2. Hypokalemia/  hyponatremia. Renal function stable with serum cr at 0,42, K continue to be low at 3,2 and Na 133, bicarbonate is 22.   Continue K correction with Kcl 80 meq in 2 divided doses and follow up on renal panel in am, avoid IV fluids in the setting of acute viral pneumonia.    3. Follow up urine analysis with negative nitrates.   4. Pregnancy and breast nodule per primary team.    Status is: Inpatient  Remains inpatient appropriate because:IV treatments appropriate due to intensity of illness or inability to take PO   Dispo: The patient is from: Home              Anticipated d/c is to: Home              Anticipated d/c date is: 3 days              Patient currently is not medically stable to d/c.   DVT prophylaxis: Enoxaparin   Code Status:   full  Family Communication:  No family at the bedside        Subjective: Patient is feeling better but not yet back to baseline, no nausea or vomiting, has been working with incentive spirometer.   Objective: Vitals:   08/20/20 1345 08/20/20 1350 08/20/20 1500 08/20/20 1600  BP:      Pulse:      Resp:      Temp:      TempSrc:      SpO2: 94% 94% 98% 95%  Weight:  Height:        Intake/Output Summary (Last 24 hours) at 08/20/2020 1624 Last data filed at 08/20/2020 1200 Gross per 24 hour  Intake 440.94 ml  Output --  Net 440.94 ml   Filed Weights   08/19/20 2132  Weight: 76 kg    Examination:   General: Not in pain or dyspnea Neurology: Awake and alert, non focal  E ENT: no pallor, no icterus, oral mucosa moist Cardiovascular: No JVD. S1-S2 present, rhythmic, no gallops, rubs, or murmurs. No lower extremity edema. Pulmonary: positive breath sounds bilaterally, no wheezing. Gastrointestinal. Abdomen soft and non tender Skin. No rashes Musculoskeletal: no joint deformities     Data Reviewed: I have personally reviewed following labs and imaging studies  CBC: Recent Labs  Lab 08/19/20 1850 08/20/20 0611  WBC  6.1 5.3  NEUTROABS 5.2 4.5  HGB 11.0* 9.9*  HCT 33.7* 29.7*  MCV 83.4 82.7  PLT 186 672   Basic Metabolic Panel: Recent Labs  Lab 08/19/20 1850 08/19/20 2036 08/20/20 0611  NA 134*  --  133*  K 2.8*  --  3.2*  CL 98  --  101  CO2 23  --  22  GLUCOSE 114*  --  144*  BUN <5*  --  <5*  CREATININE 0.47  --  0.42*  CALCIUM 8.1*  --  7.7*  MG  --  1.3*  --    GFR: Estimated Creatinine Clearance: 97 mL/min (A) (by C-G formula based on SCr of 0.42 mg/dL (L)). Liver Function Tests: Recent Labs  Lab 08/19/20 1850 08/20/20 0611  AST 71* 80*  ALT 71* 79*  ALKPHOS 109 90  BILITOT 1.0 0.9  PROT 6.5 6.0*  ALBUMIN 2.6* 2.2*   No results for input(s): LIPASE, AMYLASE in the last 168 hours. No results for input(s): AMMONIA in the last 168 hours. Coagulation Profile: No results for input(s): INR, PROTIME in the last 168 hours. Cardiac Enzymes: No results for input(s): CKTOTAL, CKMB, CKMBINDEX, TROPONINI in the last 168 hours. BNP (last 3 results) No results for input(s): PROBNP in the last 8760 hours. HbA1C: No results for input(s): HGBA1C in the last 72 hours. CBG: No results for input(s): GLUCAP in the last 168 hours. Lipid Profile: No results for input(s): CHOL, HDL, LDLCALC, TRIG, CHOLHDL, LDLDIRECT in the last 72 hours. Thyroid Function Tests: Recent Labs    08/20/20 0611  TSH 0.900   Anemia Panel: Recent Labs    08/19/20 1850  FERRITIN 313*      Radiology Studies: I have reviewed all of the imaging during this hospital visit personally     Scheduled Meds: . dexamethasone (DECADRON) injection  6 mg Intravenous Q24H  . docusate sodium  100 mg Oral Daily  . enoxaparin (LOVENOX) injection  40 mg Subcutaneous Q24H  . prenatal multivitamin  1 tablet Oral Q1200   Continuous Infusions: . remdesivir 100 mg in NS 100 mL 100 mg (08/20/20 1046)     LOS: 1 day        Timiya Howells Gerome Apley, MD

## 2020-08-21 DIAGNOSIS — J9601 Acute respiratory failure with hypoxia: Secondary | ICD-10-CM

## 2020-08-21 DIAGNOSIS — R059 Cough, unspecified: Secondary | ICD-10-CM

## 2020-08-21 LAB — COMPREHENSIVE METABOLIC PANEL
ALT: 78 U/L — ABNORMAL HIGH (ref 0–44)
AST: 69 U/L — ABNORMAL HIGH (ref 15–41)
Albumin: 2.4 g/dL — ABNORMAL LOW (ref 3.5–5.0)
Alkaline Phosphatase: 122 U/L (ref 38–126)
Anion gap: 10 (ref 5–15)
BUN: 5 mg/dL — ABNORMAL LOW (ref 6–20)
CO2: 23 mmol/L (ref 22–32)
Calcium: 8.2 mg/dL — ABNORMAL LOW (ref 8.9–10.3)
Chloride: 104 mmol/L (ref 98–111)
Creatinine, Ser: 0.44 mg/dL (ref 0.44–1.00)
GFR, Estimated: >60 mL/min (ref >60)
Glucose, Bld: 107 mg/dL — ABNORMAL HIGH (ref 70–99)
Potassium: 4 mmol/L (ref 3.5–5.1)
Sodium: 137 mmol/L (ref 135–145)
Total Bilirubin: 0.9 mg/dL (ref 0.3–1.2)
Total Protein: 6.2 g/dL — ABNORMAL LOW (ref 6.5–8.1)

## 2020-08-21 LAB — FERRITIN: Ferritin: 348 ng/mL — ABNORMAL HIGH (ref 11–307)

## 2020-08-21 LAB — D-DIMER, QUANTITATIVE: D-Dimer, Quant: 0.68 ug/mL-FEU — ABNORMAL HIGH (ref 0.00–0.50)

## 2020-08-21 LAB — C-REACTIVE PROTEIN: CRP: 6.4 mg/dL — ABNORMAL HIGH (ref <1.0)

## 2020-08-21 MED ORDER — ZINC SULFATE 220 (50 ZN) MG PO CAPS
220.0000 mg | ORAL_CAPSULE | Freq: Every day | ORAL | Status: DC
  Administered 2020-08-21 – 2020-08-22 (×2): 220 mg via ORAL
  Filled 2020-08-21 (×2): qty 1

## 2020-08-21 MED ORDER — ASCORBIC ACID 500 MG PO TABS
500.0000 mg | ORAL_TABLET | Freq: Every day | ORAL | Status: DC
  Administered 2020-08-21 – 2020-08-22 (×2): 500 mg via ORAL
  Filled 2020-08-21 (×2): qty 1

## 2020-08-21 MED ORDER — DEXAMETHASONE 6 MG PO TABS
6.0000 mg | ORAL_TABLET | Freq: Every day | ORAL | Status: DC
  Administered 2020-08-21 – 2020-08-22 (×2): 6 mg via ORAL
  Filled 2020-08-21 (×2): qty 1

## 2020-08-21 NOTE — Evaluation (Signed)
Physical Therapy Evaluation Patient Details Name: Haley Rodgers MRN: [id number] DOB: August 02, 1986 Today's Date: 08/21/2020   History of Present Illness  Pt is a 34 y/o female who is [redacted] weeks pregnant and presents with COVID PNA and tachycardia. Pt sent to [facility] from Urgent Care to rule out blood clots. Pt negative for DVT. PMH significant for gestational diabetes.    Clinical Impression  Pt admitted with above diagnosis. Overall, pt managing well with COVID symptoms and is functioning at an independent level without an AD. Pt on RA throughout session, with sats at 96% at rest, decreasing to 90% during ambulation. Sats continued to drop to 88% after seated rest post ambulation, but quickly rebounded to 95% within 20 seconds. Pt currently with functional limitations due to the deficits listed below (see PT Problem List). PT will continue to monitor while admitted. Pt will benefit from skilled PT to increase their independence and safety with mobility to allow discharge to the venue listed below.       Follow Up Recommendations No PT follow up;Supervision - Intermittent    Equipment Recommendations  None recommended by PT    Recommendations for Other Services       Precautions / Restrictions Precautions Precautions: None Restrictions Weight Bearing Restrictions: No      Mobility  Bed Mobility Overal bed mobility: Independent             General bed mobility comments: HOB elevated, but pt able to transition easily to EOB.    Transfers Overall transfer level: Independent Equipment used: None             General transfer comment: No assist required for power-up to full stand. Pt able to stand with min use of UE's for support - no increased time required.  Ambulation/Gait Ambulation/Gait assistance: Independent Gait Distance (Feet): 250 Feet Assistive device: None Gait Pattern/deviations: Wide base of support;Decreased stride length Gait velocity: Decreased due to  small area Gait velocity interpretation: 1.31 - 2.62 ft/sec, indicative of limited community ambulator General Gait Details: Pt ambulated around room only 2 covid precautions for ~5 minutes. Pt on RA, and sats decreased to 90% during ambulation.  Stairs            Wheelchair Mobility    Modified Rankin (Stroke Patients Only)       Balance Overall balance assessment: Modified Independent                                           Pertinent Vitals/Pain Pain Assessment: Faces Faces Pain Scale: Hurts little more Pain Location: Pt reports pain when coughing in chest Pain Intervention(s): Monitored during session    [place] expects to be discharged to:: Private residence Living Arrangements: Spouse/significant other;Children Available Help at Discharge: Family;Available 24 hours/day Type of Home: House Home Access: Stairs to enter Entrance Stairs-Rails: Psychiatric nurse of Steps: 3 Home Layout: Two level;Able to live on main level with bedroom/bathroom Home Equipment: Shower seat - built in      Prior Function Level of Independence: Independent         Comments: 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     Hand Dominance   Dominant Hand: Right    Extremity/Trunk Assessment   Upper Extremity Assessment Upper Extremity Assessment: Overall WFL for tasks assessed    Lower Extremity Assessment Lower Extremity Assessment: Overall  WFL for tasks assessed    Cervical / Trunk Assessment Cervical / Trunk Assessment: Normal  Communication   Communication: No difficulties  Cognition Arousal/Alertness: Awake/alert Behavior During Therapy: WFL for tasks assessed/performed Overall Cognitive Status: Within Functional Limits for tasks assessed                                        General Comments      Exercises     Assessment/Plan    PT Assessment Patient needs continued PT services  PT Problem  List Decreased activity tolerance;Cardiopulmonary status limiting activity       PT Treatment Interventions DME instruction;Gait training;Functional mobility training;Therapeutic activities;Patient/family education    PT Goals (Current goals can be found in the Care Plan section)  Acute Rehab PT Goals Patient Stated Goal: Home ASAP PT Goal Formulation: With patient Time For Goal Achievement: 08/28/20 Potential to Achieve Goals: Good    Frequency Min 3X/week   Barriers to discharge        Co-evaluation               AM-PAC PT "6 Clicks" Mobility  Outcome Measure Help needed turning from your back to your side while in a flat bed without using bedrails?: None Help needed moving from lying on your back to sitting on the side of a flat bed without using bedrails?: None Help needed moving to and from a bed to a chair (including a wheelchair)?: None Help needed standing up from a chair using your arms (e.g., wheelchair or bedside chair)?: None Help needed to walk in hospital room?: None Help needed climbing 3-5 steps with a railing? : None 6 Click Score: 24    End of Session   Activity Tolerance: Patient tolerated treatment well Patient left: with call bell/phone within reach (Sitting EOB) Nurse Communication: Mobility status PT Visit Diagnosis: Other (comment) (COVID PNA with pregnancy)    [id number] PT Time Calculation (min) (ACUTE ONLY): 22 min   Charges:   PT Evaluation $PT Eval Low Complexity: [place], PT, DPT Acute Rehabilitation Services Pager: [phone] Office: [phone]   Thelma Comp 08/21/2020, 10:42 AM

## 2020-08-21 NOTE — Progress Notes (Signed)
OT Cancellation Note  Patient Details Name: Haley Rodgers MRN: [id number] DOB: 1986/08/17   Cancelled Treatment:    Reason Eval/Treat Not Completed: OT screened, no needs identified, will sign off (PT eval on 12/21, and pt near baseline function. Pt has all needed DME. Please reconsult if needs and status changes.)  [place], OTR/L Acute Rehab Pager: [phone] Office: [phone] 08/21/2020, 8:30 AM

## 2020-08-21 NOTE — Consult Note (Signed)
Medical Consultation   Marticia Pusateri Schoeller  [id number]  DOB: 04-09-1986  DOA: 08/19/2020  PCP: Patient, No Pcp Per    Requesting physician:   Reason for consultation: Covid pneumonia   History of Present Illness: Mrs. Allerd is being consulted for SARS COVID-19 viral pneumonia.  34 year old, not vaccinated for COVID-19, on her 22th week of pregnancy tested positive for COVID-19 on 08/11/20, her symptoms were consistent with nasal congestion, fever, chills, chest pain, cough and dyspnea.  On her initial physical examination she was tachycardic 110 bpm, tachypneic 20 bpm, oxygen saturation was 96 to 97% on room. Her lungs had rales bilaterally, heart s1 and s2 present, abdomen soft, no lower extremity edema.  Sodium 134, potassium 2.8, chloride 98, bicarb 23, glucose 114, BUN less than 5 creatinine 0.47, white count 6.1, hemoglobin 11.0 hematocrit 33.7 platelets 186.  Urine analysis specific gravity 1.019.  Chest radiograph bilateral interstitial infiltrates at bases in the periphery.  CT chest with bilateral peripheral patchy opacities predominantly basal.  EKG, 106 bpm normal axis, normal intervals, sinus rhythm, no st segment or t wave changes.    Covid vaccination; unvaccinated Review of Systems:  Review of Systems  Constitutional: Negative.   HENT: Negative.   Eyes: Negative.   Respiratory: Positive for cough and shortness of breath. Negative for hemoptysis, sputum production and wheezing.   Cardiovascular: Negative.   Gastrointestinal: Negative.   Genitourinary: Negative.   Musculoskeletal: Negative.   Skin: Negative.   Neurological: Negative.   Endo/Heme/Allergies: Negative.   Psychiatric/Behavioral: Negative.      Past Medical History: Past Medical History:  Diagnosis Date   Gestational diabetes    Medical history non-contributory     Past Surgical History: Past Surgical History:  Procedure Laterality Date   CESAREAN SECTION N/A  03/01/2018   Procedure: CESAREAN SECTION;  Surgeon: Olga Millers, MD;  Location: [facility];  Service: Obstetrics;  Laterality: N/A;     Allergies:  No Known Allergies   Social History:  reports that she has never smoked. She has never used smokeless tobacco. She reports previous alcohol use. She reports that she does not use drugs.   Family History: Family History  Problem Relation Age of Onset   Cancer Father    Diabetes Father    Hyperlipidemia Maternal Grandmother    Cancer Maternal Grandfather    Hyperlipidemia Maternal Grandfather      Procedures/Significant Events:    I have personally reviewed and interpreted all radiology studies and my findings are as above.  VENTILATOR SETTINGS: Room air 12/21 SPO2: 95%  Cultures 12/19 blood LEFT AC NGTD 12/19 urine no growth   Antimicrobials: Anti-infectives (From admission, onward)   Start     Ordered Stop   08/20/20 1000  remdesivir 100 mg in sodium chloride 0.9 % 100 mL IVPB       "Followed by" Linked Group Details   08/19/20 2226 08/24/20 0959   08/19/20 2300  remdesivir 200 mg in sodium chloride 0.9% 250 mL IVPB       "Followed by" Linked Group Details   08/19/20 2226 08/20/20 0049       Devices   LINES / TUBES:      Continuous Infusions:  remdesivir 100 mg in NS 100 mL 100 mg (08/21/20 1008)     Physical Exam: Vitals:   08/21/20 0500 08/21/20 0549 08/21/20 0833 08/21/20 1325  BP:   102/68 107/68  Pulse:   82 93  Resp:   19 19  Temp:  97.9 F (36.6 C) 98.1 F (36.7 C) 97.8 F (36.6 C)  TempSrc:  Oral Oral Oral  SpO2: 95%   97%  Weight:      Height:        General: A/O x4, No acute respiratory distress Eyes: negative scleral hemorrhage, negative anisocoria, negative icterus ENT: Negative Runny nose, negative gingival bleeding, Neck:  Negative scars, masses, torticollis, lymphadenopathy, JVD Lungs: Clear to auscultation bilaterally without wheezes or  crackles Cardiovascular: Regular rate and rhythm without murmur gallop or rub normal S1 and S2 Abdomen: negative abdominal pain, nondistended, positive soft, bowel sounds, no rebound, no ascites, no appreciable mass Extremities: No significant cyanosis, clubbing, or edema bilateral lower extremities Skin: Negative rashes, lesions, ulcers Psychiatric:  Negative depression, negative anxiety, negative fatigue, negative mania  Central nervous system:  Cranial nerves II through XII intact, tongue/uvula midline, all extremities muscle strength 5/5, sensation intact throughout, negative dysarthria, negative expressive aphasia, negative receptive aphasia.  Data reviewed:  I have personally reviewed following labs and imaging studies Labs:  CBC: Recent Labs  Lab 08/19/20 1850 08/20/20 0611  WBC 6.1 5.3  NEUTROABS 5.2 4.5  HGB 11.0* 9.9*  HCT 33.7* 29.7*  MCV 83.4 82.7  PLT 186 0000000    Basic Metabolic Panel: Recent Labs  Lab 08/19/20 1850 08/19/20 2036 08/20/20 0611 08/21/20 0812  NA 134*  --  133* 137  K 2.8*  --  3.2* 4.0  CL 98  --  101 104  CO2 23  --  22 23  GLUCOSE 114*  --  144* 107*  BUN <5*  --  <5* 5*  CREATININE 0.47  --  0.42* 0.44  CALCIUM 8.1*  --  7.7* 8.2*  MG  --  1.3*  --   --    GFR Estimated Creatinine Clearance: 97 mL/min (by C-G formula based on SCr of 0.44 mg/dL). Liver Function Tests: Recent Labs  Lab 08/19/20 1850 08/20/20 0611 08/21/20 0812  AST 71* 80* 69*  ALT 71* 79* 78*  ALKPHOS 109 90 122  BILITOT 1.0 0.9 0.9  PROT 6.5 6.0* 6.2*  ALBUMIN 2.6* 2.2* 2.4*   No results for input(s): LIPASE, AMYLASE in the last 168 hours. No results for input(s): AMMONIA in the last 168 hours. Coagulation profile No results for input(s): INR, PROTIME in the last 168 hours.  Cardiac Enzymes: No results for input(s): CKTOTAL, CKMB, CKMBINDEX, TROPONINI in the last 168 hours. BNP: Invalid input(s): POCBNP CBG: No results for input(s): GLUCAP in the last 168  hours. D-Dimer Recent Labs    08/20/20 0611 08/21/20 0812  DDIMER 0.38 0.68*   Hgb A1c No results for input(s): HGBA1C in the last 72 hours. Lipid Profile No results for input(s): CHOL, HDL, LDLCALC, TRIG, CHOLHDL, LDLDIRECT in the last 72 hours. Thyroid function studies Recent Labs    08/20/20 0611  TSH 0.900   Anemia work up Recent Labs    08/19/20 1850 08/21/20 0812  FERRITIN 313* 348*   Urinalysis    Component Value Date/Time   COLORURINE YELLOW 08/20/2020 0104   APPEARANCEUR CLEAR 08/20/2020 0104   LABSPEC 1.019 08/20/2020 0104   PHURINE 7.0 08/20/2020 0104   GLUCOSEU 50 (A) 08/20/2020 0104   HGBUR NEGATIVE 08/20/2020 0104   BILIRUBINUR NEGATIVE 08/20/2020 0104   KETONESUR 20 (A) 08/20/2020 0104   PROTEINUR NEGATIVE 08/20/2020 0104   NITRITE NEGATIVE 08/20/2020 0104   LEUKOCYTESUR NEGATIVE 08/20/2020 0104  Microbiology Recent Results (from the past 240 hour(s))  Culture, blood (routine x 2)     Status: None (Preliminary result)   Collection Time: 08/19/20 10:51 PM   Specimen: BLOOD  Result Value Ref Range Status   Specimen Description BLOOD LEFT ANTECUBITAL  Final   Special Requests   Final    BOTTLES DRAWN AEROBIC AND ANAEROBIC Blood Culture adequate volume   Culture   Final    NO GROWTH 2 DAYS Performed at [facility], [address] [place], [place] [address]    Report Status PENDING  Incomplete  Culture, blood (routine x 2)     Status: None (Preliminary result)   Collection Time: 08/19/20 10:52 PM   Specimen: BLOOD  Result Value Ref Range Status   Specimen Description BLOOD LEFT ANTECUBITAL  Final   Special Requests   Final    BOTTLES DRAWN AEROBIC AND ANAEROBIC Blood Culture adequate volume   Culture   Final    NO GROWTH 2 DAYS Performed at [facility], [place] [address] [place], [place] [address]    Report Status PENDING  Incomplete  Culture, Urine     Status: None   Collection Time: 08/20/20  1:03 AM   Specimen: Urine,  Random  Result Value Ref Range Status   Specimen Description URINE, RANDOM  Final   Special Requests NONE  Final   Culture   Final    NO GROWTH Performed at [facility], [place] [address], [place], [place] [address]    Report Status 08/20/2020 FINAL  Final       Inpatient Medications:   Scheduled Meds:  dexamethasone (DECADRON) injection  6 mg Intravenous Q24H   docusate sodium  100 mg Oral Daily   enoxaparin (LOVENOX) injection  40 mg Subcutaneous Q24H   Ipratropium-Albuterol  1 puff Inhalation QID   prenatal multivitamin  1 tablet Oral Q1200   Continuous Infusions:  remdesivir 100 mg in NS 100 mL 100 mg (08/21/20 1008)     Radiological Exams on Admission: CT ANGIO CHEST PE W OR WO CONTRAST  Result Date: 08/19/2020 CLINICAL DATA:  PE suspected.  Pregnant.  Worsening cough. EXAM: CT ANGIOGRAPHY CHEST WITH CONTRAST TECHNIQUE: Multidetector CT imaging of the chest was performed using the standard protocol during bolus administration of intravenous contrast. Multiplanar CT image reconstructions and MIPs were obtained to evaluate the vascular anatomy. CONTRAST:  102mL OMNIPAQUE IOHEXOL 350 MG/ML SOLN COMPARISON:  None. FINDINGS: Cardiovascular: Evaluation for pulmonary emboli is limited by suboptimal contrast bolus timing. Within that limitation, there is no central pulmonary embolus. The size of the main pulmonary artery is normal. Heart size is normal, with no pericardial effusion. The course and caliber of the aorta are normal. There is no atherosclerotic calcification. Opacification decreased due to pulmonary arterial phase contrast bolus timing. Mediastinum/Nodes: -- No mediastinal lymphadenopathy. -- No hilar lymphadenopathy. -- No axillary lymphadenopathy. -- No supraclavicular lymphadenopathy. -- Normal thyroid gland where visualized. -  Unremarkable esophagus. Lungs/Pleura: There are hazy multifocal airspace opacities at the lung bases bilaterally. There is no  pneumothorax. There is no large pleural effusion. Upper Abdomen: Contrast bolus timing is not optimized for evaluation of the abdominal organs. The visualized portions of the organs of the upper abdomen are normal. Musculoskeletal: No chest wall abnormality. No bony spinal canal stenosis. There is a small nodule in the left breast measuring approximately 5 mm (axial series 5, image 26). Review of the MIP images confirms the above findings. IMPRESSION:  1. Evaluation for pulmonary emboli is limited by suboptimal contrast bolus timing. Within that limitation, there is no central pulmonary embolus. 2. Hazy multifocal airspace opacities at the lung bases bilaterally, concerning for multifocal pneumonia (viral or bacterial). 3. Small 5 mm nodule in the left breast. Follow-up with outpatient mammography is recommended. Electronically Signed   By: Constance Holster M.D.   On: 08/19/2020 20:48   DG CHEST PORT 1 VIEW  Result Date: 08/19/2020 CLINICAL DATA:  Shortness of breath, pregnant EXAM: PORTABLE CHEST 1 VIEW COMPARISON:  None. FINDINGS: Single frontal view of the chest demonstrates an unremarkable cardiac silhouette. There are patchy bilateral areas of airspace disease greatest at the left lung base. No effusion or pneumothorax. No acute bony abnormalities. IMPRESSION: 1. Patchy bibasilar airspace disease, favor multifocal pneumonia over edema. Pattern is consistent with COVID-19. Electronically Signed   By: Randa Ngo M.D.   On: 08/19/2020 19:17   VAS [place] LOWER EXTREMITY VENOUS (DVT)  Result Date: 08/20/2020  Lower Venous DVT Study Indications: Postitive ddimer.  Comparison Study: no prior Performing Technologist: Abram Sander RVS  Examination Guidelines: A complete evaluation includes B-mode imaging, spectral Doppler, color Doppler, and power Doppler as needed of all accessible portions of each vessel. Bilateral testing is considered an integral part of a complete examination. Limited examinations for  reoccurring indications may be performed as noted. The reflux portion of the exam is performed with the patient in reverse Trendelenburg.  +---------+---------------+---------+-----------+----------+--------------+  RIGHT     Compressibility Phasicity Spontaneity Properties Thrombus Aging  +---------+---------------+---------+-----------+----------+--------------+  CFV       Full            Yes       Yes                                    +---------+---------------+---------+-----------+----------+--------------+  SFJ       Full                                                             +---------+---------------+---------+-----------+----------+--------------+  FV Prox   Full                                                             +---------+---------------+---------+-----------+----------+--------------+  FV Mid    Full                                                             +---------+---------------+---------+-----------+----------+--------------+  FV Distal Full                                                             +---------+---------------+---------+-----------+----------+--------------+  PFV  Full                                                             +---------+---------------+---------+-----------+----------+--------------+  POP       Full            Yes       Yes                                    +---------+---------------+---------+-----------+----------+--------------+  PTV       Full                                                             +---------+---------------+---------+-----------+----------+--------------+  PERO      Full                                                             +---------+---------------+---------+-----------+----------+--------------+   +---------+---------------+---------+-----------+----------+-------------------+  LEFT      Compressibility Phasicity Spontaneity Properties Thrombus Aging        +---------+---------------+---------+-----------+----------+-------------------+  CFV       Full            Yes       Yes                                         +---------+---------------+---------+-----------+----------+-------------------+  SFJ       Full                                                                  +---------+---------------+---------+-----------+----------+-------------------+  FV Prox   Full                                                                  +---------+---------------+---------+-----------+----------+-------------------+  FV Mid    Full                                                                  +---------+---------------+---------+-----------+----------+-------------------+  FV Distal Full                                                                  +---------+---------------+---------+-----------+----------+-------------------+  PFV       Full                                                                  +---------+---------------+---------+-----------+----------+-------------------+  POP       Full            Yes       Yes                                         +---------+---------------+---------+-----------+----------+-------------------+  PTV       Full                                                                  +---------+---------------+---------+-----------+----------+-------------------+  PERO                                                       Not well visualized  +---------+---------------+---------+-----------+----------+-------------------+     Summary: BILATERAL: - No evidence of deep vein thrombosis seen in the lower extremities, bilaterally. - No evidence of superficial venous thrombosis in the lower extremities, bilaterally. -No evidence of popliteal cyst, bilaterally.   *See table(s) above for measurements and observations. Electronically signed by Deitra Mayo MD on 08/20/2020 at 4:37:13 PM.    Final      Impression/Recommendations Active Problems:   COVID-19   Pneumonia due to COVID-19 virus   COVID-19 affecting pregnancy in second trimester  SARS covid 19 viral pneumonia COVID-19 Labs  Recent Labs    08/19/20 1850 08/19/20 1926 08/20/20 0611 08/21/20 0812  DDIMER  --  0.80* 0.38 0.68*  FERRITIN 313*  --   --  348*  CRP 13.1*  --  12.1* 6.4*    No results found for: [place] -Patient states feeling much better, and patient's Covid markers are trending down. -12/21 ambulatory SPO2 pending -12/21 from our standpoint patient appears stable to complete Remdesivir as outpatient.  Patient aware that if OB/GYN decides to discharge her she would have to return to infusion center to complete course of treatment.   -If OB/GYN decides to discharge patient ensure patient completes 10-day course of steroids. -12/21 change Decadron IV to Decadron p.o. 6 mg daily -Combivent QID -Zinc 220 mg daily -Vitamin C 500 mg daily -Incentive spirometry -Flutter valve   Hypokalemia -Resolved  Hyponatremia -Resolved  Pregnancy and breast nodule -Per primary team        Thank you for this consultation.  Our [facility] hospitalist team will follow the patient with you.   Time Spent: 35 minutes  Kamaiyah [place], Geraldo Docker M.D. [facility] 08/21/2020, 3:09 PM  [phone]

## 2020-08-21 NOTE — Progress Notes (Signed)
Kalasia Crafton Quale 34 y.o. [id number] at [redacted]w[redacted]d HD#3 admitted with COVID with pneumonia S: Feeling improved this morning. Reports some discomfort with cough, otherwise no chest pain and shortness of breath. + FM. No contractions, LOF, VB.   O: Vitals:   08/21/20 0437 08/21/20 0500 08/21/20 0549 08/21/20 0833  BP: 106/71   102/68  Pulse: 88   82  Resp: 17   19  Temp: (!) 97.5 F (36.4 C)  97.9 F (36.6 C) 98.1 F (36.7 C)  TempSrc: Oral  Oral Oral  SpO2: 96% 95%    Weight:      Height:       Doppler FHR: 124bpm  A/p: Royston Bake Hilburn 34 y.o. [id number] at [redacted]w[redacted]d HD#3  admitted with [place]: tested positive 12/12, Hospitalist service managing COVID infection, appreciate their assistance.  -On room air.  -On remdesivir, decadron -For symptomatic support robitussin, offered tylenol prn, patient declines.  -Lower extremity dopplers negative -Lovenox for DVT prophylaxis   2. Pregnancy -IVF pregnancy -H/o GHTN, on ASA for preeclampsia prevention -Weak D phenotype, will need rhogam -Anatomy scan with megacystis. Referred to MFM. 11/3: Megacystis seen again on [place]. Declined amniocentesis. No other markers of aneuploidy observed. 11/30 MFM Anatomy---normal anatomy, no megacystis on exam. Referred for fetal ECHO due to IVF. Recommended f/u w MFM in 4 weeks to double check kidneys and bladder, scheduled 12/30, discussed possible need to reschedule for Jan pending clinical status of COVID 3. Breast lump: 29mm left breast nodule seen on CT during this admission for covid 19, recommendation is f/u mammogram as outpatient. Patient made aware of results and need for outpatient follow up.   I connected with Jeneva Schweizer Reedy on 08/21/20 by phone and verified that I am speaking with the correct person using two identifiers.  I discussed the limitations of evaluation and management by telemedicine. The patient expressed understanding and agreed to proceed. Rowland Lathe 08/21/20 10:20  AM

## 2020-08-21 NOTE — Progress Notes (Signed)
Patient ambulating in room per order with O2 sats of 98% or greater.

## 2020-08-22 ENCOUNTER — Other Ambulatory Visit (HOSPITAL_COMMUNITY): Payer: Self-pay | Admitting: Obstetrics and Gynecology

## 2020-08-22 LAB — CBC WITH DIFFERENTIAL/PLATELET
Abs Immature Granulocytes: 0.19 10*3/uL — ABNORMAL HIGH (ref 0.00–0.07)
Basophils Absolute: 0 10*3/uL (ref 0.0–0.1)
Basophils Relative: 0 %
Eosinophils Absolute: 0 10*3/uL (ref 0.0–0.5)
Eosinophils Relative: 0 %
HCT: 30.7 % — ABNORMAL LOW (ref 36.0–46.0)
Hemoglobin: 9.8 g/dL — ABNORMAL LOW (ref 12.0–15.0)
Immature Granulocytes: 5 %
Lymphocytes Relative: 17 %
Lymphs Abs: 0.7 10*3/uL (ref 0.7–4.0)
MCH: 26.8 pg (ref 26.0–34.0)
MCHC: 31.9 g/dL (ref 30.0–36.0)
MCV: 83.9 fL (ref 80.0–100.0)
Monocytes Absolute: 0.3 10*3/uL (ref 0.1–1.0)
Monocytes Relative: 8 %
Neutro Abs: 2.7 10*3/uL (ref 1.7–7.7)
Neutrophils Relative %: 70 %
Platelets: 267 10*3/uL (ref 150–400)
RBC: 3.66 MIL/uL — ABNORMAL LOW (ref 3.87–5.11)
RDW: 14.6 % (ref 11.5–15.5)
WBC: 3.9 10*3/uL — ABNORMAL LOW (ref 4.0–10.5)
nRBC: 0 % (ref 0.0–0.2)

## 2020-08-22 LAB — COMPREHENSIVE METABOLIC PANEL
ALT: 62 U/L — ABNORMAL HIGH (ref 0–44)
AST: 41 U/L (ref 15–41)
Albumin: 2.4 g/dL — ABNORMAL LOW (ref 3.5–5.0)
Alkaline Phosphatase: 108 U/L (ref 38–126)
Anion gap: 13 (ref 5–15)
BUN: 6 mg/dL (ref 6–20)
CO2: 20 mmol/L — ABNORMAL LOW (ref 22–32)
Calcium: 8.1 mg/dL — ABNORMAL LOW (ref 8.9–10.3)
Chloride: 104 mmol/L (ref 98–111)
Creatinine, Ser: 0.32 mg/dL — ABNORMAL LOW (ref 0.44–1.00)
GFR, Estimated: >60 mL/min (ref >60)
Glucose, Bld: 103 mg/dL — ABNORMAL HIGH (ref 70–99)
Potassium: 3.5 mmol/L (ref 3.5–5.1)
Sodium: 137 mmol/L (ref 135–145)
Total Bilirubin: 0.7 mg/dL (ref 0.3–1.2)
Total Protein: 5.8 g/dL — ABNORMAL LOW (ref 6.5–8.1)

## 2020-08-22 LAB — MAGNESIUM: Magnesium: 1.3 mg/dL — ABNORMAL LOW (ref 1.7–2.4)

## 2020-08-22 LAB — D-DIMER, QUANTITATIVE: D-Dimer, Quant: 0.79 ug/mL-FEU — ABNORMAL HIGH (ref 0.00–0.50)

## 2020-08-22 LAB — FERRITIN: Ferritin: 245 ng/mL (ref 11–307)

## 2020-08-22 LAB — PHOSPHORUS: Phosphorus: 3.7 mg/dL (ref 2.5–4.6)

## 2020-08-22 LAB — LACTATE DEHYDROGENASE: LDH: 145 U/L (ref 98–192)

## 2020-08-22 LAB — C-REACTIVE PROTEIN: CRP: 2.3 mg/dL — ABNORMAL HIGH (ref <1.0)

## 2020-08-22 MED ORDER — DEXAMETHASONE 6 MG PO TABS: 6.0000 mg | ORAL_TABLET | Freq: Every day | ORAL | 0 refills | Status: DC

## 2020-08-22 MED ORDER — ASCORBIC ACID 500 MG PO TABS: 500.0000 mg | ORAL_TABLET | Freq: Every day | ORAL | 0 refills | Status: DC

## 2020-08-22 MED ORDER — IPRATROPIUM-ALBUTEROL 20-100 MCG/ACT IN AERS: 1.0000 | INHALATION_SPRAY | Freq: Four times a day (QID) | RESPIRATORY_TRACT | 0 refills | Status: DC

## 2020-08-22 MED ORDER — ZINC SULFATE 220 (50 ZN) MG PO CAPS: 220.0000 mg | ORAL_CAPSULE | Freq: Every day | ORAL | 0 refills | Status: DC

## 2020-08-22 MED ORDER — MAGNESIUM SULFATE 50 % IJ SOLN
3.0000 g | Freq: Once | INTRAVENOUS | Status: AC
  Administered 2020-08-22: 3 g via INTRAVENOUS
  Filled 2020-08-22: qty 6

## 2020-08-22 MED FILL — COMBIVENT RESPIMAT INHAL SP: 20-100 | 30 days supply | Qty: 4 | Fill #0

## 2020-08-22 MED FILL — DEXAMETHASONE 2 MG TABLET: 2 | 6 days supply | Qty: 18 | Fill #0

## 2020-08-22 NOTE — Discharge Instructions (Signed)
Patient scheduled for outpatient Remdesivir infusions at 12 NOON on Thursday 12/23 at  Hospital. Please inform the patient to park at [address], [place], as staff will be escorting the patient through the east entrance of the hospital. Appointments take approximately 45 minutes.    There is a wave flag banner located near the entrance on [address] Turn into this entrance and immediately turn left or right and park in 1 of the 10 designated Covid Infusion Parking spots. There is a phone number on the sign, please call and let the staff know what spot you are in and we will come out and get you. For questions call [phone].  Thanks.    

## 2020-08-22 NOTE — Consult Note (Signed)
   [facility] Inpatient Consult   08/22/2020  Shawonda Kerce Apr 13, 1986 [id number]   [facility] [ACO] Patient: [place] plan  Patient is currently assigned with [place] Coordinator for disease management and/or community resource support for post hospital follow up for needs.     Plan: Patient will be followed by [place] Coordinator.   For additional questions or referrals please contact:   Natividad Brood, RN BSN [place] [facility] Liaison  [phone] business mobile phone Toll free office [phone]  Fax number: [phone] [email or web address]

## 2020-08-22 NOTE — Discharge Summary (Signed)
     Postpartum Discharge Summary       Patient Name: Haley Rodgers DOB: 17-Dec-1985 MRN: [id number]  Date of admission: 08/19/2020 Date of discharge: 08/22/2020  Admitting diagnosis: COVID-19 [U07.1] Pneumonia due to COVID-19 virus [U07.1, J12.82] COVID-19 affecting pregnancy in second trimester [O98.512, U07.1] Intrauterine pregnancy: [redacted]w[redacted]d     Secondary diagnosis:  Active Problems:   COVID-19   Pneumonia due to COVID-19 virus   COVID-19 affecting pregnancy in second trimester      Discharge diagnosis: Covid Pneumonia, [redacted] weeks gestation                                          Hospital course: Admitted 12/19, negative CT PE. Received daily remdesivir, IV decadron, zinc, vitamin C. Had potassium repleated. Blood cultures negative. LE dopplers negative for DVT   Physical exam  Vitals:   08/21/20 2337 08/22/20 0450 08/22/20 0824 08/22/20 1408  BP: 105/69 100/70 114/79 112/72  Pulse: 86 81 93 90  Resp: 18  18 18   Temp: 98 F (36.7 C) 98 F (36.7 C) 97.8 F (36.6 C) 98.2 F (36.8 C)  TempSrc: Oral Oral Oral Oral  SpO2: 98% 97% 98% 98%  Weight:      Height:       Labs: Lab Results  Component Value Date   WBC 3.9 (L) 08/22/2020   HGB 9.8 (L) 08/22/2020   HCT 30.7 (L) 08/22/2020   MCV 83.9 08/22/2020   PLT 267 08/22/2020   CMP Latest Ref Rng & Units 08/22/2020  Glucose 70 - 99 mg/dL 103(H)  BUN 6 - 20 mg/dL 6  Creatinine 0.44 - 1.00 mg/dL 0.32(L)  Sodium 135 - 145 mmol/L 137  Potassium 3.5 - 5.1 mmol/L 3.5  Chloride 98 - 111 mmol/L 104  CO2 22 - 32 mmol/L 20(L)  Calcium 8.9 - 10.3 mg/dL 8.1(L)  Total Protein 6.5 - 8.1 g/dL 5.8(L)  Total Bilirubin 0.3 - 1.2 mg/dL 0.7  Alkaline Phos 38 - 126 U/L 108  AST 15 - 41 U/L 41  ALT 0 - 44 U/L 62(H)   Flavia Shipper Score: Edinburgh Postnatal Depression Scale Screening Tool 03/02/2018  I have been able to laugh and see the funny side of things. 0  I have looked forward with enjoyment to things. 0  I have blamed myself  unnecessarily when things went wrong. 0  I have been anxious or worried for no good reason. 0  I have felt scared or panicky for no good reason. 0  Things have been getting on top of me. 0  I have been so unhappy that I have had difficulty sleeping. 0  I have felt sad or miserable. 0  I have been so unhappy that I have been crying. 0  The thought of harming myself has occurred to me. 0  Edinburgh Postnatal Depression Scale Total 0      After visit meds:     Discharge home in stable condition Future Appointments: Future Appointments  Date Time Provider [place]  08/23/2020 12:00 PM [id number] WL-INF None  08/30/2020  3:00 PM [facility] NURSE [facility]  08/30/2020  3:15 PM [facility] US2 [facility]US [place]   Follow up Visit:      08/22/2020 Vanessa Kick, MD

## 2020-08-22 NOTE — Progress Notes (Signed)
Patient scheduled for outpatient Remdesivir infusions at 12 NOON on Thursday 12/23 at [facility]. Please inform the patient to park at [place], as staff will be escorting the patient through the [place] entrance of the hospital. Appointments take approximately 45 minutes.    There is a wave flag banner located near the entrance on [address]. Turn into this entrance and immediately turn left or right and park in 1 of the 10 designated Covid Infusion Parking spots. There is a phone number on the sign, please call and let the staff know what spot you are in and we will come out and get you. For questions call [phone].  Thanks.

## 2020-08-22 NOTE — Consult Note (Signed)
Medical Consultation   Ronnell Umphenour Beason  [id number]  DOB: [birth year]  DOA: 08/19/2020  PCP: Patient, No Pcp Per    Requesting physician:   Reason for consultation: Covid pneumonia   History of Present Illness: Mrs. Allerd is being consulted for SARS COVID-19 viral pneumonia.  34 year old, not vaccinated for COVID-19, on her 22th week of pregnancy tested positive for COVID-19 on 08/11/20, her symptoms were consistent with nasal congestion, fever, chills, chest pain, cough and dyspnea.  On her initial physical examination she was tachycardic 110 bpm, tachypneic 20 bpm, oxygen saturation was 96 to 97% on room. Her lungs had rales bilaterally, heart s1 and s2 present, abdomen soft, no lower extremity edema.  Sodium 134, potassium 2.8, chloride 98, bicarb 23, glucose 114, BUN less than 5 creatinine 0.47, white count 6.1, hemoglobin 11.0 hematocrit 33.7 platelets 186.  Urine analysis specific gravity 1.019.  Chest radiograph bilateral interstitial infiltrates at bases in the periphery.  CT chest with bilateral peripheral patchy opacities predominantly basal.  EKG, 106 bpm normal axis, normal intervals, sinus rhythm, no st segment or t wave changes.  12/22 patient would like to be discharged and complete her remdesivir at the outpatient infusion center.  Negative fever, negative S OB, negative nausea, negative vomiting.    Covid vaccination; unvaccinated Review of Systems:  Review of Systems  Constitutional: Negative.   HENT: Negative.   Eyes: Negative.   Respiratory: Positive for cough and shortness of breath. Negative for hemoptysis, sputum production and wheezing.   Cardiovascular: Negative.   Gastrointestinal: Negative.   Genitourinary: Negative.   Musculoskeletal: Negative.   Skin: Negative.   Neurological: Negative.   Endo/Heme/Allergies: Negative.   Psychiatric/Behavioral: Negative.      Past Medical History: Past Medical History:  Diagnosis Date   . Gestational diabetes   . Medical history non-contributory     Past Surgical History: Past Surgical History:  Procedure Laterality Date  . CESAREAN SECTION N/A 03/01/2018   Procedure: CESAREAN SECTION;  Surgeon: Olga Millers, MD;  Location: [place];  Service: Obstetrics;  Laterality: N/A;     Allergies:  No Known Allergies   Social History:  reports that she has never smoked. She has never used smokeless tobacco. She reports previous alcohol use. She reports that she does not use drugs.   Family History: Family History  Problem Relation Age of Onset  . Cancer Father   . Diabetes Father   . Hyperlipidemia Maternal Grandmother   . Cancer Maternal Grandfather   . Hyperlipidemia Maternal Grandfather      Procedures/Significant Events:    I have personally reviewed and interpreted all radiology studies and my findings are as above.  VENTILATOR SETTINGS: Room air 12/22 SPO2: 95%  Cultures 12/19 blood LEFT AC NGTD 12/19 urine no growth   Antimicrobials: Anti-infectives (From admission, onward)   Start     Ordered Stop   08/20/20 1000  remdesivir 100 mg in sodium chloride 0.9 % 100 mL IVPB       "Followed by" Linked Group Details   08/19/20 2226 08/24/20 0959   08/19/20 2300  remdesivir 200 mg in sodium chloride 0.9% 250 mL IVPB       "Followed by" Linked Group Details   08/19/20 2226 08/20/20 0049       Devices   LINES / TUBES:      Continuous Infusions: . remdesivir 100 mg in NS 100 mL  100 mg (08/22/20 0942)     Physical Exam: Vitals:   08/21/20 2003 08/21/20 2337 08/22/20 0450 08/22/20 0824  BP: 115/73 105/69 100/70 114/79  Pulse: (!) 103 86 81 93  Resp: 18 18  18   Temp: 98.1 F (36.7 C) 98 F (36.7 C) 98 F (36.7 C) 97.8 F (36.6 C)  TempSrc: Oral Oral Oral Oral  SpO2: 98% 98% 97% 98%  Weight:      Height:        General: A/O x4, No acute respiratory distress Eyes: negative scleral hemorrhage, negative anisocoria,  negative icterus ENT: Negative Runny nose, negative gingival bleeding, Neck:  Negative scars, masses, torticollis, lymphadenopathy, JVD Lungs: Clear to auscultation bilaterally without wheezes or crackles Cardiovascular: Regular rate and rhythm without murmur gallop or rub normal S1 and S2 Abdomen: negative abdominal pain, nondistended, positive soft, bowel sounds, no rebound, no ascites, no appreciable mass Extremities: No significant cyanosis, clubbing, or edema bilateral lower extremities Skin: Negative rashes, lesions, ulcers Psychiatric:  Negative depression, negative anxiety, negative fatigue, negative mania  Central nervous system:  Cranial nerves II through XII intact, tongue/uvula midline, all extremities muscle strength 5/5, sensation intact throughout, negative dysarthria, negative expressive aphasia, negative receptive aphasia.  Data reviewed:  I have personally reviewed following labs and imaging studies Labs:  CBC: Recent Labs  Lab 08/19/20 1850 08/20/20 0611 08/22/20 0742  WBC 6.1 5.3 3.9*  NEUTROABS 5.2 4.5 2.7  HGB 11.0* 9.9* 9.8*  HCT 33.7* 29.7* 30.7*  MCV 83.4 82.7 83.9  PLT 186 178 086    Basic Metabolic Panel: Recent Labs  Lab 08/19/20 1850 08/19/20 2036 08/20/20 0611 08/21/20 0812 08/22/20 0742  NA 134*  --  133* 137 137  K 2.8*  --  3.2* 4.0 3.5  CL 98  --  101 104 104  CO2 23  --  22 23 20*  GLUCOSE 114*  --  144* 107* 103*  BUN <5*  --  <5* 5* 6  CREATININE 0.47  --  0.42* 0.44 0.32*  CALCIUM 8.1*  --  7.7* 8.2* 8.1*  MG  --  1.3*  --   --  1.3*  PHOS  --   --   --   --  3.7   GFR Estimated Creatinine Clearance: 97 mL/min (A) (by C-G formula based on SCr of 0.32 mg/dL (L)). Liver Function Tests: Recent Labs  Lab 08/19/20 1850 08/20/20 0611 08/21/20 0812 08/22/20 0742  AST 71* 80* 69* 41  ALT 71* 79* 78* 62*  ALKPHOS 109 90 122 108  BILITOT 1.0 0.9 0.9 0.7  PROT 6.5 6.0* 6.2* 5.8*  ALBUMIN 2.6* 2.2* 2.4* 2.4*   No results for  input(s): LIPASE, AMYLASE in the last 168 hours. No results for input(s): AMMONIA in the last 168 hours. Coagulation profile No results for input(s): INR, PROTIME in the last 168 hours.  Cardiac Enzymes: No results for input(s): CKTOTAL, CKMB, CKMBINDEX, TROPONINI in the last 168 hours. BNP: Invalid input(s): POCBNP CBG: No results for input(s): GLUCAP in the last 168 hours. D-Dimer Recent Labs    08/21/20 0812 08/22/20 0742  DDIMER 0.68* 0.79*   Hgb A1c No results for input(s): HGBA1C in the last 72 hours. Lipid Profile No results for input(s): CHOL, HDL, LDLCALC, TRIG, CHOLHDL, LDLDIRECT in the last 72 hours. Thyroid function studies Recent Labs    08/20/20 0611  TSH 0.900   Anemia work up Recent Labs    08/21/20 0812 08/22/20 0742  FERRITIN 348* 245  Urinalysis    Component Value Date/Time   COLORURINE YELLOW 08/20/2020 0104   APPEARANCEUR CLEAR 08/20/2020 0104   LABSPEC 1.019 08/20/2020 0104   PHURINE 7.0 08/20/2020 0104   GLUCOSEU 50 (A) 08/20/2020 0104   HGBUR NEGATIVE 08/20/2020 0104   BILIRUBINUR NEGATIVE 08/20/2020 0104   KETONESUR 20 (A) 08/20/2020 0104   PROTEINUR NEGATIVE 08/20/2020 0104   NITRITE NEGATIVE 08/20/2020 0104   LEUKOCYTESUR NEGATIVE 08/20/2020 0104     Microbiology Recent Results (from the past 240 hour(s))  Culture, blood (routine x 2)     Status: None (Preliminary result)   Collection Time: 08/19/20 10:51 PM   Specimen: BLOOD  Result Value Ref Range Status   Specimen Description BLOOD LEFT ANTECUBITAL  Final   Special Requests   Final    BOTTLES DRAWN AEROBIC AND ANAEROBIC Blood Culture adequate volume   Culture   Final    NO GROWTH 2 DAYS Performed at [facility], [place] [address] [place], [place] [address]    Report Status PENDING  Incomplete  Culture, blood (routine x 2)     Status: None (Preliminary result)   Collection Time: 08/19/20 10:52 PM   Specimen: BLOOD  Result Value Ref Range Status   Specimen  Description BLOOD LEFT ANTECUBITAL  Final   Special Requests   Final    BOTTLES DRAWN AEROBIC AND ANAEROBIC Blood Culture adequate volume   Culture   Final    NO GROWTH 2 DAYS Performed at [facility], [place] [address] [place], [place] [address]    Report Status PENDING  Incomplete  Culture, Urine     Status: None   Collection Time: 08/20/20  1:03 AM   Specimen: Urine, Random  Result Value Ref Range Status   Specimen Description URINE, RANDOM  Final   Special Requests NONE  Final   Culture   Final    NO GROWTH Performed at [facility], [place] [address], [place], [place] [address]    Report Status 08/20/2020 FINAL  Final       Inpatient Medications:   Scheduled Meds: . vitamin C  500 mg Oral Daily  . dexamethasone  6 mg Oral Daily  . docusate sodium  100 mg Oral Daily  . enoxaparin (LOVENOX) injection  40 mg Subcutaneous Q24H  . Ipratropium-Albuterol  1 puff Inhalation QID  . prenatal multivitamin  1 tablet Oral Q1200  . zinc sulfate  220 mg Oral Daily   Continuous Infusions: . remdesivir 100 mg in NS 100 mL 100 mg (08/22/20 0942)     Radiological Exams on Admission: No results found.  Impression/Recommendations Active Problems:   COVID-19   Pneumonia due to COVID-19 virus   COVID-19 affecting pregnancy in second trimester  SARS covid 19 viral pneumonia COVID-19 Labs  Recent Labs    08/19/20 1850 08/19/20 1926 08/20/20 0611 08/21/20 0812 08/22/20 0742  DDIMER  --    < > 0.38 0.68* 0.79*  FERRITIN 313*  --   --  348* 245  LDH  --   --   --   --  145  CRP 13.1*  --  12.1* 6.4* 2.3*   < > = values in this interval not displayed.    No results found for: [place] -Patient states feeling much better, and patient's Covid markers are trending down. -12/21 ambulatory SPO2 pending -12/21 from our standpoint patient appears stable to complete Remdesivir as outpatient.  Patient aware that if OB/GYN decides to discharge her  she would have to return  to infusion center to complete course of treatment.   -If OB/GYN decides to discharge patient ensure patient completes 10-day course of steroids. -12/21 change Decadron IV to Decadron p.o. 6 mg daily -Combivent QID -Zinc 220 mg daily -Vitamin C 500 mg daily -Incentive spirometry -Flutter valve -12/22 OB/GYN will discharge patient today.  Have coordinated with myself, and the Remdesivir infusion clinic in order for patient to obtain outpatient infusion.  Unfortunately clinic will be closed on Thursday therefore patient will only receive 4/5 Remdesivir dose.  After careful review of patient's chart I believe patient will do okay with only 4/5 doses of Remdesivir -Patient has been counseled that if she has any signs or symptoms of increasing O2 need, or gastroenteritis issues that she will need to return to ED immediately  Hypokalemia -Resolved  Hypomagnesmia  -Magnesium IV 3 g prior to discharge  Hyponatremia -Resolved  Pregnancy and breast nodule -Per primary team        Thank you for this consultation.  [facility] hospitalist team will sign off.   Time Spent: 35 minutes  Joleen Stuckert, Geraldo Docker M.D. [facility] 08/22/2020, 12:19 PM  [id number]

## 2020-08-23 ENCOUNTER — Other Ambulatory Visit: Payer: Self-pay | Admitting: *Deleted

## 2020-08-23 ENCOUNTER — Ambulatory Visit (HOSPITAL_COMMUNITY)
Admit: 2020-08-23 | Discharge: 2020-08-23 | Disposition: A | Discharge status: Home / Self Care | Payer: 59 | Source: Ambulatory Visit | Attending: Pulmonary Disease | Admitting: Pulmonary Disease

## 2020-08-23 ENCOUNTER — Encounter: Payer: Self-pay | Admitting: *Deleted

## 2020-08-23 DIAGNOSIS — J1282 Pneumonia due to coronavirus disease 2019: Secondary | ICD-10-CM | POA: Insufficient documentation

## 2020-08-23 DIAGNOSIS — U071 COVID-19: Secondary | ICD-10-CM | POA: Diagnosis not present

## 2020-08-23 MED ORDER — EPINEPHRINE 0.3 MG/0.3ML IJ SOAJ: 0.3000 mg | Freq: Once | INTRAMUSCULAR | Status: DC | PRN

## 2020-08-23 MED ORDER — DIPHENHYDRAMINE HCL 50 MG/ML IJ SOLN: 50.0000 mg | Freq: Once | INTRAMUSCULAR | Status: DC | PRN

## 2020-08-23 MED ORDER — SODIUM CHLORIDE 0.9 % IV SOLN: INTRAVENOUS | Status: DC | PRN

## 2020-08-23 MED ORDER — METHYLPREDNISOLONE SODIUM SUCC 125 MG IJ SOLR: 125.0000 mg | Freq: Once | INTRAMUSCULAR | Status: DC | PRN

## 2020-08-23 MED ORDER — ALBUTEROL SULFATE HFA 108 (90 BASE) MCG/ACT IN AERS: 2.0000 | INHALATION_SPRAY | Freq: Once | RESPIRATORY_TRACT | Status: DC | PRN

## 2020-08-23 MED ORDER — SODIUM CHLORIDE 0.9 % IV SOLN
100.0000 mg | Freq: Once | INTRAVENOUS | Status: AC
  Administered 2020-08-23: 100 mg via INTRAVENOUS

## 2020-08-23 MED ORDER — FAMOTIDINE IN NACL 20-0.9 MG/50ML-% IV SOLN: 20.0000 mg | Freq: Once | INTRAVENOUS | Status: DC | PRN

## 2020-08-23 NOTE — Progress Notes (Signed)
  Diagnosis: COVID-19  Physician: Dr. Joya Gaskins  Procedure: Covid Infusion Clinic Med: remdesivir infusion - Provided patient with remdesivir fact sheet for patients, parents and caregivers prior to infusion.  Complications: No immediate complications noted.  Discharge: Discharged home   Haley Rodgers 08/23/2020

## 2020-08-23 NOTE — Discharge Instructions (Signed)
10 Things You Can Do to Manage Your COVID-19 Symptoms at Home If you have possible or confirmed COVID-19: 1. Stay home from work and school. And stay away from other public places. If you must go out, avoid using any kind of public transportation, ridesharing, or taxis. 2. Monitor your symptoms carefully. If your symptoms get worse, call your healthcare provider immediately. 3. Get rest and stay hydrated. 4. If you have a medical appointment, call the healthcare provider ahead of time and tell them that you have or may have COVID-19. 5. For medical emergencies, call 911 and notify the dispatch personnel that you have or may have COVID-19. 6. Cover your cough and sneezes with a tissue or use the inside of your elbow. 7. Wash your hands often with soap and water for at least 20 seconds or clean your hands with an alcohol-based hand sanitizer that contains at least 60% alcohol. 8. As much as possible, stay in a specific room and away from other people in your home. Also, you should use a separate bathroom, if available. If you need to be around other people in or outside of the home, wear a mask. 9. Avoid sharing personal items with other people in your household, like dishes, towels, and bedding. 10. Clean all surfaces that are touched often, like counters, tabletops, and doorknobs. Use household cleaning sprays or wipes according to the label instructions. cdc.gov/coronavirus 03/02/2019 This information is not intended to replace advice given to you by your health care provider. Make sure you discuss any questions you have with your health care provider. Document Revised: 08/04/2019 Document Reviewed: 08/04/2019 Elsevier Patient Education  2020 Elsevier Inc.  

## 2020-08-23 NOTE — Patient Outreach (Signed)
[facility]) Care Management  08/23/2020  Chakita Mcgraw Mander Oct 25, 1985 [id number]   Transition of care call/case closure   Referral received:08/22/20 Initial outreach:08/23/20 Insurance: UMR    Subjective: Initial successful telephone call to patient's preferred number in order to complete transition of care assessment; 2 HIPAA identifiers verified. Explained purpose of call and completed transition of care assessment.  Naveen states that she is doing okay, reports getting a little winded with activity. She reports oxygen saturation 96%, little cough. Reinforced continued use of flutter and incentive spirometry. She denies having fever. She reports tolerating diet, denies bowel or bladder problems.  Spouse are assisting with her  recovery.   Reviewed accessing the following [place] Benefits : She denies any ongoing health issues and says she does not need a referral to one of the [place] chronic disease management programs.  She does not have the hospital indemnity, patient has spoken with Health at work and and has information of [place] anticipating being able to return to work [employer]. She  is a Marine scientist at [employer] .  She  uses a [facility] at [facility]    Objective:  Mrs. Araki  was hospitalized at [facility] 12/19- 08/22/20 for Pneumonia due to Covid 19 affecting pregnancy in second trimester  Comorbidities include: Prior history of gestational diabetes. She was discharged to home on 08/22/20 without the need for home health services or DME. She is scheduled for outpatient Remdesvir infusion on 08/23/20    Assessment:  Patient voices good understanding of all discharge instructions.  See transition of care flowsheet for assessment details.   Plan:  Reviewed hospital discharge diagnosis of Covid 19   and discharge treatment plan using hospital discharge instructions, assessing medication adherence, reviewing problems  requiring provider notification, and discussing the importance of follow up with surgeon, primary care provider and/or specialists as directed.  Reviewed [place] healthy lifestyle program information to receive discounted premium for  2022   Step 1: Get  your annual physical  Step 2: Complete your health assessment  Step 3:Identify your current health status and complete the corresponding action step between January 1, and May 02, 2020.     No ongoing care management needs identified so will close case to [place] Management services and route successful outreach letter with [place] Management pamphlet and 24 Hour Nurse Line Magnet to [place] Management clinical pool to be mailed to patient's home address.    Joylene Draft, RN, BSN  [place] Management Coordinator  [phone]- Mobile [phone]- Toll Free Main Office

## 2020-08-24 LAB — CULTURE, BLOOD (ROUTINE X 2)
Culture: NO GROWTH
Culture: NO GROWTH
Special Requests: ADEQUATE
Special Requests: ADEQUATE

## 2020-08-27 ENCOUNTER — Ambulatory Visit: Payer: 59

## 2020-08-30 ENCOUNTER — Encounter: Payer: Self-pay | Admitting: *Deleted

## 2020-08-30 ENCOUNTER — Ambulatory Visit: Payer: 59 | Admitting: *Deleted

## 2020-08-30 ENCOUNTER — Other Ambulatory Visit: Payer: Self-pay

## 2020-08-30 ENCOUNTER — Ambulatory Visit: Payer: 59 | Attending: Obstetrics

## 2020-08-30 VITALS — BP 124/81 | HR 106

## 2020-08-30 DIAGNOSIS — O09522 Supervision of elderly multigravida, second trimester: Secondary | ICD-10-CM | POA: Insufficient documentation

## 2020-08-30 DIAGNOSIS — Z98891 History of uterine scar from previous surgery: Secondary | ICD-10-CM | POA: Insufficient documentation

## 2020-08-30 DIAGNOSIS — Z3A23 23 weeks gestation of pregnancy: Secondary | ICD-10-CM | POA: Diagnosis not present

## 2020-08-30 DIAGNOSIS — O322XX0 Maternal care for transverse and oblique lie, not applicable or unspecified: Secondary | ICD-10-CM | POA: Diagnosis not present

## 2020-08-30 DIAGNOSIS — O34219 Maternal care for unspecified type scar from previous cesarean delivery: Secondary | ICD-10-CM | POA: Diagnosis not present

## 2020-08-30 DIAGNOSIS — Z362 Encounter for other antenatal screening follow-up: Secondary | ICD-10-CM

## 2020-08-30 DIAGNOSIS — O09812 Supervision of pregnancy resulting from assisted reproductive technology, second trimester: Secondary | ICD-10-CM

## 2020-08-30 DIAGNOSIS — O09292 Supervision of pregnancy with other poor reproductive or obstetric history, second trimester: Secondary | ICD-10-CM | POA: Diagnosis not present

## 2020-08-30 NOTE — Progress Notes (Signed)
C/o "shortness of breath." Recovering from COVID. O2 sat 99%.

## 2020-09-03 ENCOUNTER — Other Ambulatory Visit: Payer: Self-pay | Admitting: *Deleted

## 2020-09-14 DIAGNOSIS — R0600 Dyspnea, unspecified: Secondary | ICD-10-CM | POA: Diagnosis not present

## 2020-09-14 DIAGNOSIS — Z8616 Personal history of COVID-19: Secondary | ICD-10-CM | POA: Diagnosis not present

## 2020-09-14 DIAGNOSIS — R000 Tachycardia, unspecified: Secondary | ICD-10-CM | POA: Diagnosis not present

## 2020-09-18 ENCOUNTER — Ambulatory Visit: Payer: 59 | Admitting: Internal Medicine

## 2020-09-18 ENCOUNTER — Encounter: Payer: Self-pay | Admitting: Internal Medicine

## 2020-09-18 ENCOUNTER — Other Ambulatory Visit: Payer: Self-pay

## 2020-09-18 DIAGNOSIS — U071 COVID-19: Secondary | ICD-10-CM

## 2020-09-18 DIAGNOSIS — J1282 Pneumonia due to coronavirus disease 2019: Secondary | ICD-10-CM | POA: Diagnosis not present

## 2020-09-18 NOTE — Patient Instructions (Addendum)
Make sure you check your oxygen saturation and pulse rate at your highest level of activity to be sure it stays over 90% and keep track of it at least once a week, more often if breathing getting worse, and let me know if losing ground.   No need for further inhalers.    GERD (REFLUX)  is an extremely common cause of respiratory symptoms just like yours , many times with no obvious heartburn at all.    It can be treated with medication, but also with lifestyle changes including elevation of the head of your bed (ideally with 6 -8inch blocks under the headboard of your bed),  Smoking cessation, avoidance of late meals, excessive alcohol, and avoid fatty foods, chocolate, peppermint, colas, red wine, and acidic juices such as orange juice.  NO MINT OR MENTHOL PRODUCTS SO NO COUGH DROPS  USE SUGARLESS CANDY INSTEAD (Jolley ranchers or Stover's or Life Savers) or even ice chips will also do - the key is to swallow to prevent all throat clearing. NO OIL BASED VITAMINS - use powdered substitutes.  Avoid fish oil when coughing.    Please schedule a follow up office visit in 6 weeks, call sooner if needed

## 2020-09-18 NOTE — Progress Notes (Signed)
Haley Rodgers, female    DOB: Jan 30, 1986, 35 y.o.   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  never smoker never asthma and nl IUP 2019 other than gestation dm then @ 5 m pregnant and admit with covid 19 with onset of symptoms 08/11/20 mostly ST then cough and pain with pain coughing   Date of admission: 08/19/2020 Date of discharge: 08/22/2020  Admitting diagnosis: COVID-19 [U07.1] Pneumonia due to COVID-19 virus [U07.1, J12.82] COVID-19 affecting pregnancy in second trimester [O98.512, U07.1] Intrauterine pregnancy: [redacted]w[redacted]d     Secondary diagnosis:  Active Problems:   COVID-19   Pneumonia due to COVID-19 virus   COVID-19 affecting pregnancy in second trimester                                       Discharge diagnosis: Covid Pneumonia, [redacted] weeks gestation                                          Hospital course: Admitted 12/19, negative CT PE. Received daily remdesivir, IV decadron, zinc, vitamin C. Had potassium repleated. Blood cultures negative. LE dopplers negative for DVT      History of Present Illness  09/18/2020  Pulmonary/ 1st office eval/Guhan [facility]ington referred by Caprice Beaver NP re sob/ tachycardia Chief Complaint  Patient presents with  . Pulmonary Consult    Referred by [facility].    Dyspnea: 300 ft / sats always > 95% walking across from parking deck to [facility], easier than it was riight after covid but never back to baseline = 6 m IUP (last one 2.5 y prior to OV   Cough: has recurred somewhat since stopped decadron 08/28/20 , sporadic, daytime esp p shower clear mucus with overt HBP Sleep: bed is flat, sleep on side, one pillow  SABA use: not using much at all  - tends to make heart racing worse   No obvious day to day or daytime variability or assoc excess/ purulent sputum or mucus plugs or hemoptysis or cp or chest tightness, subjective wheeze or overt sinus  symptoms.   Sleeping without nocturnal   exacerbation  of respiratory  c/o's or need for noct  saba. Also denies any obvious fluctuation of symptoms with weather or environmental changes or other aggravating or alleviating factors except as outlined above   No unusual exposure hx or h/o childhood pna/ asthma or knowledge of premature birth.  Current Allergies, Complete Past Medical History, Past Surgical History, Family History, and Social History were reviewed in [employer] record.  ROS  The following are not active complaints unless bolded Hoarseness, sore throat, dysphagia, dental problems, itching, sneezing,  nasal congestion or discharge of excess mucus or purulent secretions, ear ache,   fever, chills, sweats, unintended wt loss or wt gain, classically pleuritic or exertional cp,  orthopnea pnd or arm/hand swelling  or leg swelling, presyncope, palpitations, abdominal pain, anorexia, nausea, vomiting, diarrhea  or change in bowel habits or change in bladder habits, change in stools or change in urine, dysuria, hematuria,  rash, arthralgias, visual complaints, headache, numbness, weakness or ataxia or problems with walking or coordination,  change in mood or  memory.              Past Medical History:  Diagnosis Date  . Gestational diabetes   . Medical history non-contributory     Outpatient Medications Prior to Visit  Medication Sig Dispense Refill  . ascorbic acid (VITAMIN C) 500 MG tablet Take 1 tablet (500 mg total) by mouth daily. 30 tablet 0  . aspirin EC 81 MG tablet Take 81 mg by mouth daily. Swallow whole.    Marland Kitchen dexamethasone (DECADRON) 6 MG tablet  finished 6 days p d/c     . Ipratropium-Albuterol (COMBIVENT) 20-100 MCG/ACT AERS respimat  no longer using   0  . Prenatal Vit-Fe Fumarate-FA (PRENATAL MULTIVITAMIN) TABS tablet Take 1 tablet by mouth daily at 12 noon.    . zinc sulfate 220 (50 Zn) MG capsule Take 1 capsule (220 mg total) by mouth daily. 30 capsule 0      Objective:     LMP 03/18/2020    Wt Readings from Last 3 Encounters:   09/18/20 177 lb (80.3 kg)  08/19/20 167 lb 9.6 oz (76 kg)  03/01/18 174 lb (78.9 kg)     Vital signs reviewed  09/18/2020  - Note at rest 02 sats  100% on RA with pulse 117  Immediately after arrival   General appearance:    Healthy appearing wf nad    HEENT : pt wearing mask not removed for exam due to covid -19 concerns.    NECK :  without JVD/Nodes/TM/ nl carotid upstrokes bilaterally   LUNGS: no acc muscle use,  Nl contour chest which is clear to A and P bilaterally without cough on insp or exp maneuvers   CV:  RRR  no s3 or murmur or increase in P2, and no edema   ABD:  soft and nontender and c/w IUP of stated age  with nl inspiratory excursion in the supine position. No bruits or organomegaly appreciated, bowel sounds nl  MS:  Nl gait/ ext warm without deformities, calf tenderness, cyanosis or clubbing No obvious joint restrictions   SKIN: warm and dry without lesions    NEURO:  alert, approp, nl sensorium with  no motor or cerebellar deficits apparent.            I personally reviewed images and agree with radiology impression as follows:   Chest CTa 08/19/20 1. Evaluation for pulmonary emboli is limited by suboptimal contrast bolus timing. Within that limitation, there is no central pulmonary embolus. 2. Hazy multifocal airspace opacities at the lung bases bilaterally, concerning for multifocal pneumonia (viral or bacterial).  Labs   reviewed:      Chemistry      Component Value Date/Time   NA 137 08/22/2020 0742   K 3.5 08/22/2020 0742   CL 104 08/22/2020 0742   CO2 20 (L) 08/22/2020 0742   BUN 6 08/22/2020 0742   CREATININE 0.32 (L) 08/22/2020 0742      Component Value Date/Time   CALCIUM 8.1 (L) 08/22/2020 0742   ALKPHOS 108 08/22/2020 0742   AST 41 08/22/2020 0742   ALT 62 (H) 08/22/2020 0742   BILITOT 0.7 08/22/2020 0742        Lab Results  Component Value Date   WBC 3.9 (L) 08/22/2020   HGB 9.8 (L) 08/22/2020   HCT 30.7 (L)  08/22/2020   MCV 83.9 08/22/2020   PLT 267 08/22/2020     Lab Results  Component Value Date   DDIMER 0.79 (H) 08/22/2020      Lab Results  Component Value Date   TSH 0.900 08/20/2020  ekg 09/15/19  St at 104 no ischemic/ hypertrophic changes           Assessment   Pneumonia due to COVID-19 virus Onset of symptoms 08/11/20 during 21st week of IUP rx remdesovir, w/u  with neg venous dopplers and CTa during admit 08/19/20   She's doing well in terms of gas exchange at this point with no evidence of any significant evolving fibroproliferative dz so no need to repeat cxr/ just monitor sats at peak exercise and routine IUP surveillance for excess anemia or drop in bicarb levels which are common at his stage of pregancy and can contribute to ex intolerance along with gerd which may be contributing to the cough but which I tend to defer rx (other than diet, which we reviewed in writing) to GYN in terms of risk/ benefits.  Apparently plans cards eval for ST and suggested she continue to track both Pulse rate and 02 sats at peak levels of exertion over the next several weeks and let me know if the latter are dropping down.          Each maintenance medication was reviewed in detail including emphasizing most importantly the difference between maintenance and prns and under what circumstances the prns are to be triggered using an action plan format where appropriate.  Total time for H and P, chart review, counseling, reviewing  and generating customized AVS unique to this office visit / same day charting = 45 min           Christinia Gully, MD 09/18/2020

## 2020-09-18 NOTE — Assessment & Plan Note (Addendum)
Onset of symptoms 08/11/20 during 21st week of IUP rx remdesovir, w/u  with neg venous dopplers and CTa during admit 08/19/20   She's doing well in terms of gas exchange at this point with no evidence of any significant evolving fibroproliferative dz so no need to repeat cxr/ just monitor sats at peak exercise and routine IUP surveillance for excess anemia or drop in bicarb levels which are common at his stage of pregancy and can contribute to ex intolerance along with gerd which may be contributing to the cough but which I tend to defer rx (other than diet, which we reviewed in writing) to GYN in terms of risk/ benefits.  Apparently plans cards eval for ST and suggested she continue to track both Pulse rate and 02 sats at peak levels of exertion over the next several weeks and let me know if the latter are dropping down.          Each maintenance medication was reviewed in detail including emphasizing most importantly the difference between maintenance and prns and under what circumstances the prns are to be triggered using an action plan format where appropriate.  Total time for H and P, chart review, counseling, reviewing  and generating customized AVS unique to this office visit / same day charting = 45 min

## 2020-09-21 ENCOUNTER — Ambulatory Visit: Payer: 59

## 2020-09-24 DIAGNOSIS — Z3480 Encounter for supervision of other normal pregnancy, unspecified trimester: Secondary | ICD-10-CM | POA: Diagnosis not present

## 2020-09-24 DIAGNOSIS — Z23 Encounter for immunization: Secondary | ICD-10-CM | POA: Diagnosis not present

## 2020-09-27 ENCOUNTER — Other Ambulatory Visit: Payer: Self-pay | Admitting: Obstetrics & Gynecology

## 2020-09-27 DIAGNOSIS — N6320 Unspecified lump in the left breast, unspecified quadrant: Secondary | ICD-10-CM

## 2020-09-28 ENCOUNTER — Encounter: Payer: Self-pay | Admitting: *Deleted

## 2020-09-28 ENCOUNTER — Ambulatory Visit: Payer: 59 | Attending: Obstetrics and Gynecology

## 2020-09-28 ENCOUNTER — Other Ambulatory Visit: Payer: Self-pay

## 2020-09-28 ENCOUNTER — Ambulatory Visit: Payer: 59 | Admitting: *Deleted

## 2020-09-28 VITALS — BP 121/71 | HR 103

## 2020-09-28 DIAGNOSIS — O09523 Supervision of elderly multigravida, third trimester: Secondary | ICD-10-CM

## 2020-09-28 DIAGNOSIS — O34212 Maternal care for vertical scar from previous cesarean delivery: Secondary | ICD-10-CM | POA: Diagnosis not present

## 2020-09-28 DIAGNOSIS — Z3A27 27 weeks gestation of pregnancy: Secondary | ICD-10-CM | POA: Insufficient documentation

## 2020-09-28 DIAGNOSIS — Z8616 Personal history of COVID-19: Secondary | ICD-10-CM | POA: Insufficient documentation

## 2020-09-28 DIAGNOSIS — O09812 Supervision of pregnancy resulting from assisted reproductive technology, second trimester: Secondary | ICD-10-CM | POA: Diagnosis not present

## 2020-09-28 DIAGNOSIS — Z362 Encounter for other antenatal screening follow-up: Secondary | ICD-10-CM

## 2020-09-28 DIAGNOSIS — O09292 Supervision of pregnancy with other poor reproductive or obstetric history, second trimester: Secondary | ICD-10-CM

## 2020-09-28 DIAGNOSIS — O09522 Supervision of elderly multigravida, second trimester: Secondary | ICD-10-CM

## 2020-09-28 DIAGNOSIS — O353XX0 Maternal care for (suspected) damage to fetus from viral disease in mother, not applicable or unspecified: Secondary | ICD-10-CM

## 2020-10-01 ENCOUNTER — Other Ambulatory Visit: Payer: Self-pay | Admitting: *Deleted

## 2020-10-01 DIAGNOSIS — U071 COVID-19: Secondary | ICD-10-CM

## 2020-10-01 DIAGNOSIS — O360990 Maternal care for other rhesus isoimmunization, unspecified trimester, not applicable or unspecified: Secondary | ICD-10-CM | POA: Diagnosis not present

## 2020-10-01 DIAGNOSIS — O99810 Abnormal glucose complicating pregnancy: Secondary | ICD-10-CM | POA: Diagnosis not present

## 2020-10-02 ENCOUNTER — Ambulatory Visit
Admission: RE | Admit: 2020-10-02 | Discharge: 2020-10-02 | Disposition: A | Discharge status: Home / Self Care | Payer: 59 | Source: Ambulatory Visit | Attending: Obstetrics & Gynecology | Admitting: Obstetrics & Gynecology

## 2020-10-02 ENCOUNTER — Other Ambulatory Visit: Payer: Self-pay

## 2020-10-02 DIAGNOSIS — N6320 Unspecified lump in the left breast, unspecified quadrant: Secondary | ICD-10-CM

## 2020-10-02 DIAGNOSIS — N6489 Other specified disorders of breast: Secondary | ICD-10-CM | POA: Diagnosis not present

## 2020-10-03 ENCOUNTER — Other Ambulatory Visit (HOSPITAL_COMMUNITY): Payer: Self-pay | Admitting: Obstetrics & Gynecology

## 2020-10-03 MED FILL — FREESTYLE LITE TEST STRIP: 25 days supply | Qty: 100 | Fill #0

## 2020-10-03 MED FILL — FREESTYLE LANCETS: 25 days supply | Qty: 100 | Fill #0

## 2020-10-11 ENCOUNTER — Encounter: Payer: Self-pay | Admitting: General Practice

## 2020-10-15 DIAGNOSIS — Z369 Encounter for antenatal screening, unspecified: Secondary | ICD-10-CM | POA: Diagnosis not present

## 2020-10-26 DIAGNOSIS — Z369 Encounter for antenatal screening, unspecified: Secondary | ICD-10-CM | POA: Diagnosis not present

## 2020-11-02 ENCOUNTER — Ambulatory Visit: Payer: 59 | Attending: Maternal & Fetal Medicine

## 2020-11-02 ENCOUNTER — Ambulatory Visit: Payer: 59 | Admitting: Internal Medicine

## 2020-11-02 ENCOUNTER — Ambulatory Visit: Payer: 59 | Admitting: *Deleted

## 2020-11-02 ENCOUNTER — Other Ambulatory Visit: Payer: Self-pay

## 2020-11-02 ENCOUNTER — Encounter: Payer: Self-pay | Admitting: *Deleted

## 2020-11-02 VITALS — BP 124/85 | HR 96

## 2020-11-02 DIAGNOSIS — U071 COVID-19: Secondary | ICD-10-CM | POA: Diagnosis not present

## 2020-11-02 DIAGNOSIS — O09293 Supervision of pregnancy with other poor reproductive or obstetric history, third trimester: Secondary | ICD-10-CM

## 2020-11-02 DIAGNOSIS — O09523 Supervision of elderly multigravida, third trimester: Secondary | ICD-10-CM

## 2020-11-02 DIAGNOSIS — O98512 Other viral diseases complicating pregnancy, second trimester: Secondary | ICD-10-CM

## 2020-11-02 DIAGNOSIS — Z8616 Personal history of COVID-19: Secondary | ICD-10-CM

## 2020-11-02 DIAGNOSIS — Z3A32 32 weeks gestation of pregnancy: Secondary | ICD-10-CM

## 2020-11-02 DIAGNOSIS — O34219 Maternal care for unspecified type scar from previous cesarean delivery: Secondary | ICD-10-CM | POA: Diagnosis not present

## 2020-11-02 DIAGNOSIS — O09813 Supervision of pregnancy resulting from assisted reproductive technology, third trimester: Secondary | ICD-10-CM

## 2020-11-05 ENCOUNTER — Other Ambulatory Visit: Payer: Self-pay | Admitting: *Deleted

## 2020-11-05 DIAGNOSIS — O24415 Gestational diabetes mellitus in pregnancy, controlled by oral hypoglycemic drugs: Secondary | ICD-10-CM

## 2020-11-05 DIAGNOSIS — O24419 Gestational diabetes mellitus in pregnancy, unspecified control: Secondary | ICD-10-CM | POA: Diagnosis not present

## 2020-11-05 MED FILL — UNIFINE PENTIPS 32GX5/32: 32G X 4 MM | 30 days supply | Qty: 100 | Fill #0

## 2020-11-05 MED FILL — HUMULIN N 100 UNITS/ML KWIK: 100 | 30 days supply | Qty: 3 | Fill #0

## 2020-11-15 ENCOUNTER — Other Ambulatory Visit: Payer: Self-pay | Admitting: *Deleted

## 2020-11-15 ENCOUNTER — Encounter: Payer: Self-pay | Admitting: *Deleted

## 2020-11-15 ENCOUNTER — Other Ambulatory Visit: Payer: Self-pay

## 2020-11-15 ENCOUNTER — Ambulatory Visit: Payer: 59 | Attending: Obstetrics and Gynecology

## 2020-11-15 ENCOUNTER — Ambulatory Visit: Payer: 59 | Admitting: *Deleted

## 2020-11-15 VITALS — BP 124/77 | HR 96

## 2020-11-15 DIAGNOSIS — Z369 Encounter for antenatal screening, unspecified: Secondary | ICD-10-CM | POA: Diagnosis not present

## 2020-11-15 DIAGNOSIS — O24414 Gestational diabetes mellitus in pregnancy, insulin controlled: Secondary | ICD-10-CM | POA: Insufficient documentation

## 2020-11-15 DIAGNOSIS — O24419 Gestational diabetes mellitus in pregnancy, unspecified control: Secondary | ICD-10-CM

## 2020-11-15 DIAGNOSIS — O24415 Gestational diabetes mellitus in pregnancy, controlled by oral hypoglycemic drugs: Secondary | ICD-10-CM | POA: Diagnosis not present

## 2020-11-19 DIAGNOSIS — L299 Pruritus, unspecified: Secondary | ICD-10-CM | POA: Diagnosis not present

## 2020-11-19 DIAGNOSIS — Z369 Encounter for antenatal screening, unspecified: Secondary | ICD-10-CM | POA: Diagnosis not present

## 2020-11-23 ENCOUNTER — Encounter: Payer: Self-pay | Admitting: *Deleted

## 2020-11-23 ENCOUNTER — Other Ambulatory Visit: Payer: Self-pay

## 2020-11-23 ENCOUNTER — Ambulatory Visit: Payer: 59 | Admitting: *Deleted

## 2020-11-23 ENCOUNTER — Ambulatory Visit: Payer: 59 | Attending: Obstetrics and Gynecology

## 2020-11-23 VITALS — BP 128/80 | HR 92

## 2020-11-23 DIAGNOSIS — O24415 Gestational diabetes mellitus in pregnancy, controlled by oral hypoglycemic drugs: Secondary | ICD-10-CM

## 2020-11-23 DIAGNOSIS — O24419 Gestational diabetes mellitus in pregnancy, unspecified control: Secondary | ICD-10-CM | POA: Diagnosis not present

## 2020-11-23 DIAGNOSIS — Z369 Encounter for antenatal screening, unspecified: Secondary | ICD-10-CM | POA: Diagnosis not present

## 2020-11-26 ENCOUNTER — Other Ambulatory Visit (HOSPITAL_COMMUNITY): Payer: Self-pay | Admitting: Obstetrics and Gynecology

## 2020-11-26 DIAGNOSIS — Z3480 Encounter for supervision of other normal pregnancy, unspecified trimester: Secondary | ICD-10-CM | POA: Diagnosis not present

## 2020-11-26 DIAGNOSIS — O24414 Gestational diabetes mellitus in pregnancy, insulin controlled: Secondary | ICD-10-CM | POA: Diagnosis not present

## 2020-11-26 DIAGNOSIS — Z369 Encounter for antenatal screening, unspecified: Secondary | ICD-10-CM | POA: Diagnosis not present

## 2020-11-27 ENCOUNTER — Telehealth (HOSPITAL_COMMUNITY): Payer: Self-pay | Admitting: *Deleted

## 2020-11-27 NOTE — Telephone Encounter (Signed)
Preadmission screen  

## 2020-11-27 NOTE — Patient Instructions (Signed)
Haley Rodgers  11/27/2020   Your procedure is scheduled on:  12/05/2020  Arrive at 0830 at Entrance C on [address] at [facility]  and [address]. You are invited to use the FREE valet parking or use the Visitor's parking deck.  Pick up the phone at the desk and dial [phone].  Call this number if you have problems the morning of surgery: [phone]  Remember:   Do not eat food:(After Midnight) Desps de medianoche.  Do not drink clear liquids: (After Midnight) Desps de medianoche.  Take these medicines the morning of surgery with A SIP OF WATER:  Take 6 units of NPH insulin the night before surgery   Do not wear jewelry, make-up or nail polish.  Do not wear lotions, powders, or perfumes. Do not wear deodorant.  Do not shave 48 hours prior to surgery.  Do not bring valuables to the hospital.  [facility] is not   responsible for any belongings or valuables brought to the hospital.  Contacts, dentures or bridgework may not be worn into surgery.  Leave suitcase in the car. After surgery it may be brought to your room.  For patients admitted to the hospital, checkout time is 11:00 AM the day of              discharge.      Please read over the following fact sheets that you were given:     Preparing for Surgery

## 2020-11-28 ENCOUNTER — Encounter (HOSPITAL_COMMUNITY): Payer: Self-pay

## 2020-11-29 ENCOUNTER — Ambulatory Visit (HOSPITAL_BASED_OUTPATIENT_CLINIC_OR_DEPARTMENT_OTHER): Payer: 59

## 2020-11-29 ENCOUNTER — Ambulatory Visit: Payer: 59 | Admitting: *Deleted

## 2020-11-29 ENCOUNTER — Other Ambulatory Visit: Payer: Self-pay

## 2020-11-29 VITALS — BP 143/91 | HR 89

## 2020-11-29 DIAGNOSIS — O09813 Supervision of pregnancy resulting from assisted reproductive technology, third trimester: Secondary | ICD-10-CM | POA: Diagnosis not present

## 2020-11-29 DIAGNOSIS — Z794 Long term (current) use of insulin: Secondary | ICD-10-CM | POA: Diagnosis not present

## 2020-11-29 DIAGNOSIS — O24414 Gestational diabetes mellitus in pregnancy, insulin controlled: Secondary | ICD-10-CM

## 2020-11-29 DIAGNOSIS — O09523 Supervision of elderly multigravida, third trimester: Secondary | ICD-10-CM

## 2020-11-29 DIAGNOSIS — Z3A36 36 weeks gestation of pregnancy: Secondary | ICD-10-CM | POA: Diagnosis not present

## 2020-11-29 DIAGNOSIS — O24415 Gestational diabetes mellitus in pregnancy, controlled by oral hypoglycemic drugs: Secondary | ICD-10-CM | POA: Insufficient documentation

## 2020-11-29 DIAGNOSIS — O34219 Maternal care for unspecified type scar from previous cesarean delivery: Secondary | ICD-10-CM | POA: Diagnosis not present

## 2020-12-02 ENCOUNTER — Encounter (HOSPITAL_COMMUNITY)
Admission: AD | Disposition: A | Discharge status: Home / Self Care | Payer: Self-pay | Source: Home / Self Care | Attending: Obstetrics and Gynecology

## 2020-12-02 ENCOUNTER — Inpatient Hospital Stay (HOSPITAL_COMMUNITY): Admission: RE | Admit: 2020-12-02 | Payer: 59 | Source: Ambulatory Visit | Admitting: Obstetrics

## 2020-12-02 ENCOUNTER — Other Ambulatory Visit: Payer: Self-pay

## 2020-12-02 ENCOUNTER — Encounter (HOSPITAL_COMMUNITY): Payer: Self-pay | Admitting: Obstetrics and Gynecology

## 2020-12-02 ENCOUNTER — Inpatient Hospital Stay (HOSPITAL_COMMUNITY)
Admission: AD | Admit: 2020-12-02 | Discharge: 2020-12-04 | DRG: 787 | Disposition: A | Discharge status: Home / Self Care | Payer: 59 | Attending: Obstetrics and Gynecology | Admitting: Obstetrics and Gynecology

## 2020-12-02 ENCOUNTER — Inpatient Hospital Stay (HOSPITAL_COMMUNITY): Payer: 59 | Admitting: Anesthesiology

## 2020-12-02 DIAGNOSIS — O24414 Gestational diabetes mellitus in pregnancy, insulin controlled: Secondary | ICD-10-CM | POA: Diagnosis not present

## 2020-12-02 DIAGNOSIS — Z20822 Contact with and (suspected) exposure to covid-19: Secondary | ICD-10-CM | POA: Diagnosis not present

## 2020-12-02 DIAGNOSIS — Z3A37 37 weeks gestation of pregnancy: Secondary | ICD-10-CM

## 2020-12-02 DIAGNOSIS — O26893 Other specified pregnancy related conditions, third trimester: Secondary | ICD-10-CM | POA: Diagnosis present

## 2020-12-02 DIAGNOSIS — O34211 Maternal care for low transverse scar from previous cesarean delivery: Secondary | ICD-10-CM | POA: Diagnosis not present

## 2020-12-02 DIAGNOSIS — D62 Acute posthemorrhagic anemia: Secondary | ICD-10-CM | POA: Diagnosis not present

## 2020-12-02 DIAGNOSIS — O4292 Full-term premature rupture of membranes, unspecified as to length of time between rupture and onset of labor: Secondary | ICD-10-CM | POA: Diagnosis not present

## 2020-12-02 DIAGNOSIS — O24429 Gestational diabetes mellitus in childbirth, unspecified control: Secondary | ICD-10-CM | POA: Diagnosis not present

## 2020-12-02 DIAGNOSIS — O133 Gestational [pregnancy-induced] hypertension without significant proteinuria, third trimester: Secondary | ICD-10-CM | POA: Diagnosis not present

## 2020-12-02 DIAGNOSIS — O134 Gestational [pregnancy-induced] hypertension without significant proteinuria, complicating childbirth: Secondary | ICD-10-CM | POA: Diagnosis present

## 2020-12-02 DIAGNOSIS — O9081 Anemia of the puerperium: Secondary | ICD-10-CM | POA: Diagnosis not present

## 2020-12-02 DIAGNOSIS — O24424 Gestational diabetes mellitus in childbirth, insulin controlled: Secondary | ICD-10-CM | POA: Diagnosis not present

## 2020-12-02 DIAGNOSIS — O4290 Premature rupture of membranes, unspecified as to length of time between rupture and onset of labor, unspecified weeks of gestation: Secondary | ICD-10-CM

## 2020-12-02 LAB — CBC
HCT: 32.8 % — ABNORMAL LOW (ref 36.0–46.0)
Hemoglobin: 10.7 g/dL — ABNORMAL LOW (ref 12.0–15.0)
MCH: 27.2 pg (ref 26.0–34.0)
MCHC: 32.6 g/dL (ref 30.0–36.0)
MCV: 83.5 fL (ref 80.0–100.0)
Platelets: 154 10*3/uL (ref 150–400)
RBC: 3.93 MIL/uL (ref 3.87–5.11)
RDW: 14.4 % (ref 11.5–15.5)
WBC: 8.2 10*3/uL (ref 4.0–10.5)
nRBC: 0 % (ref 0.0–0.2)

## 2020-12-02 LAB — RESP PANEL BY RT-PCR (FLU A&B, COVID) ARPGX2
Influenza A by PCR: NEGATIVE
Influenza B by PCR: NEGATIVE
SARS Coronavirus 2 by RT PCR: NEGATIVE

## 2020-12-02 LAB — TYPE AND SCREEN
ABO/RH(D): O NEG
Antibody Screen: NEGATIVE

## 2020-12-02 LAB — POCT FERN TEST: POCT Fern Test: POSITIVE

## 2020-12-02 SURGERY — Surgical Case
Anesthesia: Spinal

## 2020-12-02 MED ORDER — DIBUCAINE (PERIANAL) 1 % EX OINT: 1.0000 "application " | TOPICAL_OINTMENT | CUTANEOUS | Status: DC | PRN

## 2020-12-02 MED ORDER — ONDANSETRON HCL 4 MG/2ML IJ SOLN: 4.0000 mg | Freq: Four times a day (QID) | INTRAMUSCULAR | Status: DC | PRN

## 2020-12-02 MED ORDER — METOCLOPRAMIDE HCL 5 MG/ML IJ SOLN
INTRAMUSCULAR | Status: AC
  Filled 2020-12-02: qty 2

## 2020-12-02 MED ORDER — KETOROLAC TROMETHAMINE 30 MG/ML IJ SOLN: 30.0000 mg | Freq: Four times a day (QID) | INTRAMUSCULAR | Status: AC | PRN

## 2020-12-02 MED ORDER — LACTATED RINGERS IV SOLN: INTRAVENOUS | Status: DC

## 2020-12-02 MED ORDER — LACTATED RINGERS IV SOLN: 500.0000 mL | INTRAVENOUS | Status: DC | PRN

## 2020-12-02 MED ORDER — MORPHINE SULFATE (PF) 0.5 MG/ML IJ SOLN
INTRAMUSCULAR | Status: AC
  Filled 2020-12-02: qty 10

## 2020-12-02 MED ORDER — BUPIVACAINE IN DEXTROSE 0.75-8.25 % IT SOLN
INTRATHECAL | Status: DC | PRN
  Administered 2020-12-02: 1.6 mL via INTRATHECAL

## 2020-12-02 MED ORDER — OXYTOCIN BOLUS FROM INFUSION: 333.0000 mL | Freq: Once | INTRAVENOUS | Status: DC

## 2020-12-02 MED ORDER — SCOPOLAMINE 1 MG/3DAYS TD PT72
MEDICATED_PATCH | TRANSDERMAL | Status: DC | PRN
  Administered 2020-12-02: 1 via TRANSDERMAL

## 2020-12-02 MED ORDER — DEXAMETHASONE SODIUM PHOSPHATE 4 MG/ML IJ SOLN
INTRAMUSCULAR | Status: AC
  Filled 2020-12-02: qty 2

## 2020-12-02 MED ORDER — METOCLOPRAMIDE HCL 5 MG/ML IJ SOLN
INTRAMUSCULAR | Status: DC | PRN
  Administered 2020-12-02: 10 mg via INTRAVENOUS

## 2020-12-02 MED ORDER — NALOXONE HCL 0.4 MG/ML IJ SOLN: 0.4000 mg | INTRAMUSCULAR | Status: DC | PRN

## 2020-12-02 MED ORDER — FENTANYL CITRATE (PF) 100 MCG/2ML IJ SOLN
INTRAMUSCULAR | Status: DC | PRN
  Administered 2020-12-02: 10 ug via INTRATHECAL

## 2020-12-02 MED ORDER — PHENYLEPHRINE HCL-NACL 20-0.9 MG/250ML-% IV SOLN
INTRAVENOUS | Status: AC
  Filled 2020-12-02: qty 250

## 2020-12-02 MED ORDER — NALOXONE HCL 4 MG/10ML IJ SOLN
1.0000 ug/kg/h | INTRAVENOUS | Status: DC | PRN
  Filled 2020-12-02: qty 5

## 2020-12-02 MED ORDER — NALBUPHINE HCL 10 MG/ML IJ SOLN
5.0000 mg | Freq: Once | INTRAMUSCULAR | Status: DC | PRN
Start: 2020-12-02 — End: 2020-12-04

## 2020-12-02 MED ORDER — LACTATED RINGERS IV SOLN: INTRAVENOUS | Status: DC | PRN

## 2020-12-02 MED ORDER — DEXAMETHASONE SODIUM PHOSPHATE 4 MG/ML IJ SOLN
INTRAMUSCULAR | Status: DC | PRN
  Administered 2020-12-02: 8 mg via INTRAVENOUS

## 2020-12-02 MED ORDER — NALBUPHINE HCL 10 MG/ML IJ SOLN: 5.0000 mg | INTRAMUSCULAR | Status: DC | PRN

## 2020-12-02 MED ORDER — OXYTOCIN-SODIUM CHLORIDE 30-0.9 UT/500ML-% IV SOLN
INTRAVENOUS | Status: AC
  Filled 2020-12-02: qty 500

## 2020-12-02 MED ORDER — SOD CITRATE-CITRIC ACID 500-334 MG/5ML PO SOLN
30.0000 mL | ORAL | Status: AC
  Administered 2020-12-02: 30 mL via ORAL
  Filled 2020-12-02: qty 15

## 2020-12-02 MED ORDER — FENTANYL CITRATE (PF) 100 MCG/2ML IJ SOLN
INTRAMUSCULAR | Status: AC
  Filled 2020-12-02: qty 2

## 2020-12-02 MED ORDER — WITCH HAZEL-GLYCERIN EX PADS: 1.0000 "application " | MEDICATED_PAD | CUTANEOUS | Status: DC | PRN

## 2020-12-02 MED ORDER — CEFAZOLIN SODIUM-DEXTROSE 2-3 GM-%(50ML) IV SOLR
INTRAVENOUS | Status: DC | PRN
  Administered 2020-12-02: 2 g via INTRAVENOUS

## 2020-12-02 MED ORDER — DIPHENHYDRAMINE HCL 25 MG PO CAPS: 25.0000 mg | ORAL_CAPSULE | ORAL | Status: DC | PRN

## 2020-12-02 MED ORDER — DIPHENHYDRAMINE HCL 25 MG PO CAPS: 25.0000 mg | ORAL_CAPSULE | Freq: Four times a day (QID) | ORAL | Status: DC | PRN

## 2020-12-02 MED ORDER — KETOROLAC TROMETHAMINE 30 MG/ML IJ SOLN
INTRAMUSCULAR | Status: AC
  Filled 2020-12-02: qty 1

## 2020-12-02 MED ORDER — MEPERIDINE HCL 25 MG/ML IJ SOLN: 6.2500 mg | INTRAMUSCULAR | Status: DC | PRN

## 2020-12-02 MED ORDER — PROMETHAZINE HCL 25 MG/ML IJ SOLN: 6.2500 mg | INTRAMUSCULAR | Status: DC | PRN

## 2020-12-02 MED ORDER — OXYTOCIN-SODIUM CHLORIDE 30-0.9 UT/500ML-% IV SOLN
2.5000 [IU]/h | INTRAVENOUS | Status: AC
  Administered 2020-12-02: 2.5 [IU]/h via INTRAVENOUS
  Filled 2020-12-02: qty 500

## 2020-12-02 MED ORDER — IBUPROFEN 800 MG PO TABS
800.0000 mg | ORAL_TABLET | Freq: Three times a day (TID) | ORAL | Status: DC
  Administered 2020-12-02 – 2020-12-04 (×5): 800 mg via ORAL
  Filled 2020-12-02 (×5): qty 1

## 2020-12-02 MED ORDER — COCONUT OIL OIL
1.0000 | TOPICAL_OIL | Status: DC | PRN
Start: 2020-12-02 — End: 2020-12-04

## 2020-12-02 MED ORDER — CEFAZOLIN SODIUM-DEXTROSE 2-4 GM/100ML-% IV SOLN
INTRAVENOUS | Status: AC
  Filled 2020-12-02: qty 100

## 2020-12-02 MED ORDER — SCOPOLAMINE 1 MG/3DAYS TD PT72
MEDICATED_PATCH | TRANSDERMAL | Status: AC
  Filled 2020-12-02: qty 1

## 2020-12-02 MED ORDER — PRENATAL MULTIVITAMIN CH
1.0000 | ORAL_TABLET | Freq: Every day | ORAL | Status: DC
  Administered 2020-12-03: 1 via ORAL
  Filled 2020-12-02: qty 1

## 2020-12-02 MED ORDER — SIMETHICONE 80 MG PO CHEW: 80.0000 mg | CHEWABLE_TABLET | ORAL | Status: DC | PRN

## 2020-12-02 MED ORDER — ACETAMINOPHEN 325 MG PO TABS
650.0000 mg | ORAL_TABLET | ORAL | Status: DC | PRN
  Administered 2020-12-03: 650 mg via ORAL
  Filled 2020-12-02: qty 2

## 2020-12-02 MED ORDER — OXYCODONE HCL 5 MG PO TABS: 5.0000 mg | ORAL_TABLET | ORAL | Status: DC | PRN

## 2020-12-02 MED ORDER — OXYCODONE HCL 5 MG/5ML PO SOLN: 5.0000 mg | Freq: Once | ORAL | Status: DC | PRN

## 2020-12-02 MED ORDER — ACETAMINOPHEN 500 MG PO TABS
1000.0000 mg | ORAL_TABLET | Freq: Four times a day (QID) | ORAL | Status: AC
  Administered 2020-12-02 – 2020-12-03 (×3): 1000 mg via ORAL
  Filled 2020-12-02 (×4): qty 2

## 2020-12-02 MED ORDER — OXYTOCIN-SODIUM CHLORIDE 30-0.9 UT/500ML-% IV SOLN: 2.5000 [IU]/h | INTRAVENOUS | Status: DC

## 2020-12-02 MED ORDER — ONDANSETRON HCL 4 MG/2ML IJ SOLN: 4.0000 mg | Freq: Three times a day (TID) | INTRAMUSCULAR | Status: DC | PRN

## 2020-12-02 MED ORDER — ONDANSETRON HCL 4 MG/2ML IJ SOLN
INTRAMUSCULAR | Status: DC | PRN
  Administered 2020-12-02: 4 mg via INTRAVENOUS

## 2020-12-02 MED ORDER — DIPHENHYDRAMINE HCL 50 MG/ML IJ SOLN: 12.5000 mg | INTRAMUSCULAR | Status: DC | PRN

## 2020-12-02 MED ORDER — AMISULPRIDE (ANTIEMETIC) 5 MG/2ML IV SOLN: 10.0000 mg | Freq: Once | INTRAVENOUS | Status: DC | PRN

## 2020-12-02 MED ORDER — OXYTOCIN-SODIUM CHLORIDE 30-0.9 UT/500ML-% IV SOLN
INTRAVENOUS | Status: DC | PRN
  Administered 2020-12-02: 300 mL via INTRAVENOUS

## 2020-12-02 MED ORDER — ZOLPIDEM TARTRATE 5 MG PO TABS: 5.0000 mg | ORAL_TABLET | Freq: Every evening | ORAL | Status: DC | PRN

## 2020-12-02 MED ORDER — OXYCODONE HCL 5 MG PO TABS: 5.0000 mg | ORAL_TABLET | Freq: Once | ORAL | Status: DC | PRN

## 2020-12-02 MED ORDER — PHENYLEPHRINE HCL-NACL 20-0.9 MG/250ML-% IV SOLN
INTRAVENOUS | Status: DC | PRN
  Administered 2020-12-02: 60 ug/min via INTRAVENOUS

## 2020-12-02 MED ORDER — SODIUM CHLORIDE 0.9% FLUSH: 3.0000 mL | INTRAVENOUS | Status: DC | PRN

## 2020-12-02 MED ORDER — SENNOSIDES-DOCUSATE SODIUM 8.6-50 MG PO TABS
2.0000 | ORAL_TABLET | Freq: Every day | ORAL | Status: DC
  Administered 2020-12-03: 2 via ORAL
  Filled 2020-12-02: qty 2

## 2020-12-02 MED ORDER — TETANUS-DIPHTH-ACELL PERTUSSIS 5-2.5-18.5 LF-MCG/0.5 IM SUSY: 0.5000 mL | PREFILLED_SYRINGE | Freq: Once | INTRAMUSCULAR | Status: DC

## 2020-12-02 MED ORDER — SIMETHICONE 80 MG PO CHEW
80.0000 mg | CHEWABLE_TABLET | Freq: Three times a day (TID) | ORAL | Status: DC
  Administered 2020-12-03 – 2020-12-04 (×3): 80 mg via ORAL
  Filled 2020-12-02 (×4): qty 1

## 2020-12-02 MED ORDER — PHENYLEPHRINE 40 MCG/ML (10ML) SYRINGE FOR IV PUSH (FOR BLOOD PRESSURE SUPPORT)
PREFILLED_SYRINGE | INTRAVENOUS | Status: AC
  Filled 2020-12-02: qty 10

## 2020-12-02 MED ORDER — MORPHINE SULFATE (PF) 0.5 MG/ML IJ SOLN
INTRAMUSCULAR | Status: DC | PRN
  Administered 2020-12-02: 150 ug via INTRATHECAL

## 2020-12-02 MED ORDER — MENTHOL 3 MG MT LOZG: 1.0000 | LOZENGE | OROMUCOSAL | Status: DC | PRN

## 2020-12-02 MED ORDER — ONDANSETRON HCL 4 MG/2ML IJ SOLN
INTRAMUSCULAR | Status: AC
  Filled 2020-12-02: qty 2

## 2020-12-02 MED ORDER — KETOROLAC TROMETHAMINE 30 MG/ML IJ SOLN
30.0000 mg | Freq: Once | INTRAMUSCULAR | Status: AC | PRN
  Administered 2020-12-02: 30 mg via INTRAVENOUS

## 2020-12-02 MED ORDER — FENTANYL CITRATE (PF) 100 MCG/2ML IJ SOLN: 25.0000 ug | INTRAMUSCULAR | Status: DC | PRN

## 2020-12-02 MED ORDER — CEFAZOLIN SODIUM-DEXTROSE 2-4 GM/100ML-% IV SOLN
2.0000 g | INTRAVENOUS | Status: DC
  Filled 2020-12-02: qty 100

## 2020-12-02 SURGICAL SUPPLY — 39 items
ADH SKN CLS APL DERMABOND .7 (GAUZE/BANDAGES/DRESSINGS) ×1
APL SKNCLS STERI-STRIP NONHPOA (GAUZE/BANDAGES/DRESSINGS) ×1
BENZOIN TINCTURE PRP APPL 2/3 (GAUZE/BANDAGES/DRESSINGS) ×2 IMPLANT
CHLORAPREP W/TINT 26ML (MISCELLANEOUS) ×2 IMPLANT
CLAMP CORD UMBIL (MISCELLANEOUS) IMPLANT
CLOTH BEACON ORANGE TIMEOUT ST (SAFETY) ×2 IMPLANT
DERMABOND ADVANCED (GAUZE/BANDAGES/DRESSINGS) ×1
DERMABOND ADVANCED .7 DNX12 (GAUZE/BANDAGES/DRESSINGS) ×1 IMPLANT
DRSG OPSITE POSTOP 4X10 (GAUZE/BANDAGES/DRESSINGS) ×2 IMPLANT
ELECT REM PT RETURN 9FT ADLT (ELECTROSURGICAL) ×2
ELECTRODE REM PT RTRN 9FT ADLT (ELECTROSURGICAL) ×1 IMPLANT
EXTRACTOR VACUUM KIWI (MISCELLANEOUS) IMPLANT
GLOVE BIOGEL PI IND STRL 6.5 (GLOVE) ×1 IMPLANT
GLOVE BIOGEL PI IND STRL 7.0 (GLOVE) ×1 IMPLANT
GLOVE BIOGEL PI INDICATOR 6.5 (GLOVE) ×1
GLOVE BIOGEL PI INDICATOR 7.0 (GLOVE) ×1
GLOVE ECLIPSE 6.0 STRL STRAW (GLOVE) ×2 IMPLANT
GOWN STRL REUS W/TWL LRG LVL3 (GOWN DISPOSABLE) ×4 IMPLANT
KIT ABG SYR 3ML LUER SLIP (SYRINGE) IMPLANT
NEEDLE HYPO 25X5/8 SAFETYGLIDE (NEEDLE) IMPLANT
NS IRRIG 1000ML POUR BTL (IV SOLUTION) ×2 IMPLANT
PACK C SECTION WH (CUSTOM PROCEDURE TRAY) ×2 IMPLANT
PAD OB MATERNITY 4.3X12.25 (PERSONAL CARE ITEMS) ×2 IMPLANT
PENCIL SMOKE EVAC W/HOLSTER (ELECTROSURGICAL) ×2 IMPLANT
RTRCTR C-SECT PINK 25CM LRG (MISCELLANEOUS) ×2 IMPLANT
STRIP CLOSURE SKIN 1/2X4 (GAUZE/BANDAGES/DRESSINGS) ×2 IMPLANT
SUT MNCRL 0 VIOLET CTX 36 (SUTURE) ×2 IMPLANT
SUT MONOCRYL 0 CTX 36 (SUTURE) ×4
SUT PLAIN 0 NONE (SUTURE) IMPLANT
SUT PLAIN 2 0 (SUTURE) ×2
SUT PLAIN ABS 2-0 CT1 27XMFL (SUTURE) ×1 IMPLANT
SUT VIC AB 0 CTX 36 (SUTURE) ×4
SUT VIC AB 0 CTX36XBRD ANBCTRL (SUTURE) ×2 IMPLANT
SUT VIC AB 2-0 CT1 27 (SUTURE) ×2
SUT VIC AB 2-0 CT1 TAPERPNT 27 (SUTURE) ×1 IMPLANT
SUT VICRYL 4-0 PS2 18IN ABS (SUTURE) ×2 IMPLANT
TOWEL OR 17X24 6PK STRL BLUE (TOWEL DISPOSABLE) ×2 IMPLANT
TRAY FOLEY W/BAG SLVR 14FR LF (SET/KITS/TRAYS/PACK) ×2 IMPLANT
WATER STERILE IRR 1000ML POUR (IV SOLUTION) ×2 IMPLANT

## 2020-12-02 NOTE — Anesthesia Procedure Notes (Signed)
Spinal  Patient location during procedure: OR Start time: 12/02/2020 2:48 PM End time: 12/02/2020 2:52 PM Reason for block: surgical anesthesia Staffing Performed: anesthesiologist  Anesthesiologist: Merlinda Frederick, MD Preanesthetic Checklist Completed: patient identified, IV checked, risks and benefits discussed, surgical consent, monitors and equipment checked, pre-op evaluation and timeout performed Spinal Block Patient position: sitting Prep: DuraPrep Patient monitoring: cardiac monitor, continuous pulse ox and blood pressure Approach: midline Location: L3-4 Injection technique: single-shot Needle Needle type: Pencan  Needle gauge: 24 G Needle length: 9 cm Assessment Sensory level: T3 (bilateral) Events: CSF return Additional Notes Functioning IV was confirmed and monitors were applied. Sterile prep and drape, including hand hygiene and sterile gloves were used. The patient was positioned and the spine was prepped. The skin was anesthetized with lidocaine.  Free flow of clear CSF was obtained prior to injecting local anesthetic into the CSF.  The spinal needle aspirated freely following injection.  The needle was carefully withdrawn.  The patient tolerated the procedure well.

## 2020-12-02 NOTE — MAU Note (Signed)
Pt reports to mau with c/o SROM around 0800 this morning.  Pt states she has continued to leak clear fluid since then. Denies CTX at this time.  Is scheduled for repeat section this Wednesday.

## 2020-12-02 NOTE — MAU Provider Note (Signed)
Event Date/Time   First Provider Initiated Contact with Patient 12/02/20 1221       S: Ms. Haley Rodgers is a 35 y.o. [id number] at [redacted]w[redacted]d  who presents to MAU today complaining of leaking of fluid since 0800 this morning. She denies vaginal bleeding. She denies contractions. She reports normal fetal movement.    O: BP 134/86 (BP Location: Right Arm)   Pulse 83   Temp 98.5 F (36.9 C) (Oral)   Resp 16   LMP 03/18/2020   SpO2 96%  GENERAL: Well-developed, well-nourished female in no acute distress.  HEAD: Normocephalic, atraumatic.  CHEST: Normal effort of breathing, regular heart rate ABDOMEN: Soft, nontender, gravid PELVIC: Normal external female genitalia. Vagina is pink and rugated. Cervix with normal contour, no lesions. Normal discharge.  Positive pooling of clear fluid.   Cervical exam:  deferred     Fetal Monitoring: Baseline: 135 Variability: moderate Accelerations: 15x15 Decelerations: none Contractions: none  Results for orders placed or performed during the hospital encounter of 12/02/20 (from the past 24 hour(s))  Fern Test     Status: Abnormal   Collection Time: 12/02/20 12:40 PM  Result Value Ref Range   POCT Fern Test Positive = ruptured amniotic membanes      A: SIUP at [redacted]w[redacted]d  SROM  P: Dr. Harrington Challenger notified of SROM - patient is repeat c/section.   Intake BP elevated, pt has gestational hypertension with current pregnancy - she is asymptomatic - Dr. Harrington Challenger notified of VS.   Jorje Guild, NP 12/02/2020 12:41 PM

## 2020-12-02 NOTE — Lactation Note (Signed)
This note was copied from a baby's chart. Lactation Consultation Note  Patient Name: Haley Rodgers ASTMH'D Date: 12/02/2020 Reason for consult: Follow-up assessment;Early term 37-38.6wks;Difficult latch, GDM (insulin) Age:37 hours  Mom called again for latch assistance because baby was "due" for another feeding, but baby wasn't even cueing, mom trying to latch baby to the right breast in football hold. This is C/S baby and was still spitty, he had to burp again prior feeding attempt, explained to mom that it may take up to 24 hours to start seeing more consistent feeding cues. Baby would bite/suck gloved finger, LC assisted with hand expression and finger fed baby a few drops of colostrum.  Mom had GDM (on insulin) but both of baby's serum glucose were WNL at 49 and 66, baby has already been given Similac 20 calorie formula. Mom has flat nipples and she did voiced that she had to use a NS with her first baby. LC explained to mom that lactation will reassess tomorrow once baby start showing feeding cues before starting the use of a NS.   Mom aware that NS use will require double pumping here at the hospital and she's OK with that. 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 Reviewed normal newborn behavior, cluster feeding, feeding cues, benefits of STS care and newborn hypoglycemia.   Feeding plan:  1. Encouraged mom to feed baby STS 8-12 times/24 hours or sooner if feeding cues are present 2. She'll pre-pump prior feedings at the breast 3. She'll start wearing her breast shells tomorrow, daytime only 4. She'll supplement baby with any amount of EBM she may get. Parents aware they could also use formula if further supplementation is required  FOB present and supportive. Parents reported all questions and concerns were answered, they're aware of [place] [facility] OP services and will call PRN.   Maternal Data Has patient been taught Hand  Expression?: Yes Does the patient have breastfeeding experience prior to this delivery?: Yes How long did the patient breastfeed?: Per mom, she attempted to breastfeed her 1st child, she used NS but infant did not latch, she pumped and supplemented infant with formula for 4 months.  Feeding Mother's Current Feeding Choice: Breast Milk  LATCH Score Latch: Too sleepy or reluctant, no latch achieved, no sucking elicited.  Audible Swallowing: None  Type of Nipple: Flat  Comfort (Breast/Nipple): Soft / non-tender  Hold (Positioning): Assistance needed to correctly position infant at breast and maintain latch.  LATCH Score: 4   Lactation Tools Discussed/Used Tools: Pump;Shells Breast pump type: Manual Pump Education: Setup, frequency, and cleaning;Milk Storage Reason for Pumping: for pre-pumping, flat nipples Pumping frequency: pre-pumping or q 3 hours  Interventions Interventions: Breast feeding basics reviewed;Assisted with latch;Skin to skin;Breast massage;Hand express;Breast compression;Hand pump;Support pillows  Discharge Pump: Manual WIC Program: No  Consult Status Consult Status: Follow-up Date: 12/03/20 Follow-up type: [place] 12/02/2020, 10:44 PM

## 2020-12-02 NOTE — Anesthesia Postprocedure Evaluation (Signed)
Anesthesia Post Note  Patient: Haley Rodgers  Procedure(s) Performed: REPEAT CESAREAN SECTION (N/A )     Patient location during evaluation: Mother Baby Anesthesia Type: Spinal Level of consciousness: oriented and awake and alert Pain management: pain level controlled Vital Signs Assessment: post-procedure vital signs reviewed and stable Respiratory status: spontaneous breathing and respiratory function stable Cardiovascular status: blood pressure returned to baseline and stable Postop Assessment: no headache, no backache, no apparent nausea or vomiting, able to ambulate and spinal receding Anesthetic complications: no   No complications documented.  Last Vitals:  Vitals:   12/02/20 1640 12/02/20 1645  BP:  109/63  Pulse: 70 77  Resp: 13 19  Temp:  36.8 C  SpO2: 100% 100%    Last Pain:  Vitals:   12/02/20 1645  TempSrc: Oral  PainSc: 0-No pain   Pain Goal:      LLE Sensation: Tingling (12/02/20 1645)   RLE Sensation: Tingling (12/02/20 1645) L Sensory Level: T8 (12/02/20 1645) R Sensory Level: T8 (12/02/20 1645) Epidural/Spinal Function Cutaneous sensation: Tingles (12/02/20 1645), Patient able to flex knees: No (12/02/20 1645), Patient able to lift hips off bed: No (12/02/20 1645), Back pain beyond tenderness at insertion site: No (12/02/20 1645), Progressively worsening motor and/or sensory loss: No (12/02/20 1645), Bowel and/or bladder incontinence post epidural: No (12/02/20 1615)  Merlinda Frederick

## 2020-12-02 NOTE — Op Note (Signed)
Pre-Operative Diagnosis:  1) 37+0-week intrauterine pregnancy   2) history of prior cesarean section, desires repeat  3) spontaneous rupture of membranes   4) advanced maternal age   37) A2 gestational diabetes mellitus   6) gestational hypertension  7) IVF pregnancy Postoperative Diagnosis:   1) 37+0-week intrauterine pregnancy   2) history of prior cesarean section, desires repeat  3) spontaneous rupture of membranes   4) advanced maternal age   37) A2 gestational diabetes mellitus   6) gestational hypertension  7) IVF pregnancy Procedure: Repeat low transverse cesarean section Surgeon: Dr. Vanessa Kick Assistant: None Operative Findings: Vigorous female infant in the vertex presentation.  Apgars of 8 at 1 minute and 9 at 5 minutes.  Normal-appearing ovaries, tubes, uterus. Specimen: Placenta for disposal [id number] I/O In: 2482 [I.V.:3000; IV Piggyback:50] Out: 500 [Urine:150; Blood:229]   Procedure:Ms. Shankman is an 35 year old gravida 5 para 62 at 65 weeks and 0 days estimated gestational age who presents for cesarean section. Following the appropriate informed consent the patient was brought to the operating room where spinal anesthesia was administered and found to be adequate. She was placed in the dorsal supine position with a leftward tilt. She was prepped and draped in the normal sterile fashion.  The patient was appropriately identified during a preoperative timeout procedure.  The scalpel was then used to make a Pfannenstiel skin incision which was carried down to the underlying layers of soft tissue to the fascia. The fascia was incised in the midline and the fascial incision was extended laterally with Mayo scissors. The superior aspect of the fascial incision was grasped with Coker clamps x2, tented up and the rectus muscles dissected off sharply with the electrocautery unit area and the same procedure was repeated on the inferior aspect of the fascial incision. The rectus  muscles were separated in the midline. The abdominal peritoneum was identified, tented up, entered sharply, and the incision was extended superiorly and inferiorly with good visualization of the bladder. The Alexis retractor was then deployed. The vesicouterine peritoneum was identified, tented up, entered sharply, and the bladder flap was created digitally. Scalpel was then used to make a low transverse incision on the uterus which was extended laterally with blunt dissection.  The fetal vertex was identified, delivered easily through the uterine incision followed by the body. The infant was bulb suctioned on the operative field cried vigorously.following a 1 minute delay, the cord was clamped and cut and the infant was passed to the waiting neonatology team. Placenta was then delivered spontaneously, the uterus was cleared of all clot and debris. The uterine incision was repaired with #1 chromic in running locked fashion. Ovaries and tubes were inspected and normal. The Alexis retractor was removed. The abdominal peritoneum was reapproximated with 2-0 Vicryl in a running fashion, the rectus muscles was reapproximated with 2-0 chromic in a running fashion. The fascia was closed with a looped PDS in a running fashion. The skin was closed with 4-0 vicryl in a subcuticular fashion and Dermabond. All sponge lap and needle counts were correct. Patient tolerated the procedure well and recovered in stable condition following the procedure.

## 2020-12-02 NOTE — H&P (Signed)
Haley Rodgers is a 35 y.o. female presenting for leaking fluid  35 year old gravida 5 para 1-0-3-1 at 37+0 weeks presents for complaint of leaking fluid that started at 8 AM today.  In maternity admissions she was confirmed to have spontaneously ruptured her membranes.  The patient is scheduled for repeat cesarean section on December 05, 2020 for gestational hypertension, therefore we will proceed today.  The patient's pregnancy has been complicated by in vitro fertilization with preimplantation genetic testing, A2 gestational diabetes mellitus on insulin therapy, gestational hypertension, advanced maternal age, week D phenotype.  Patient had a Covid infection in December 2021 and was admitted to [facility] specialty care unit with Covid pneumonia.  the fetus was noted to have Mega cystitis on a antenatal ultrasound and the patient was referred to maternal-fetal medicine.  No Mega cystitis was noted on MFM scan.  Last ultrasound for estimated fetal weight on 11/02/2020 at 32 weeks was 2467 g, 5 pound 7 ounces, greater than 90th percentile OB History    Gravida  5   Para  1   Term  1   Preterm      AB  3   Living  1     SAB  3   IAB      Ectopic      Multiple      Live Births             Past Medical History:  Diagnosis Date  . Gestational diabetes   . Pregnancy induced hypertension    Past Surgical History:  Procedure Laterality Date  . CESAREAN SECTION N/A 03/01/2018   Procedure: CESAREAN SECTION;  Surgeon: Olga Millers, MD;  Location: [place];  Service: Obstetrics;  Laterality: N/A;  . WISDOM TOOTH EXTRACTION     Family History: family history includes Cancer in her father and maternal grandfather; Diabetes in her father; Hyperlipidemia in her maternal grandfather and maternal grandmother. Social History:  reports that she has never smoked. She has never used smokeless tobacco. She reports previous alcohol use. She reports that she does not use drugs.      Maternal Diabetes: Yes:  Diabetes Type:  Insulin/Medication controlled Genetic Screening: Normal Maternal Ultrasounds/Referrals: Other: NPH 10 units with breakfast and NPH 14 units at bedtime Fetal Ultrasounds or other Referrals:  Fetal echo performed for in vitro fertilization, normal Maternal Substance Abuse:  No Significant Maternal Medications:  Meds include: Other: Insulin, current regimen Significant Maternal Lab Results:  Other:  Week D phenotype Other Comments:  No Mega cystitis was noted on MFM scan.  Last ultrasound for estimated fetal weight on 11/02/2020 at 32 weeks was 2467 g, 5 pound 7 ounces, greater than 90th percentile   Review of Systems History   Blood pressure 134/86, pulse 83, temperature 98.5 F (36.9 C), temperature source Oral, resp. rate 16, last menstrual period 03/18/2020, SpO2 96 %, unknown if currently breastfeeding. Exam Physical Exam  Prenatal labs: ABO, Rh: --/--/O NEG (12/19 2251) patient is weak D phenotype Antibody: NEG (12/19 2251) Rubella: Immune (10/08 0000) RPR: Nonreactive (10/08 0000)  HBsAg: Negative (10/08 0000)  HIV: Non Reactive (12/19 2036)  GBS:   Positive  Assessment/Plan: 1) admit 2) consent for repeat cesarean section  3) SCDs to bilateral lower extremities for DVT prophylaxis 4) Ancef 2 g on-call to the OR 5) monitor blood pressures.  Treat with antihypertensives as necessary  Vanessa Kick 12/02/2020, 12:54 PM

## 2020-12-02 NOTE — Anesthesia Preprocedure Evaluation (Addendum)
Anesthesia Evaluation  Patient identified by MRN, date of birth, ID band Patient awake    Reviewed: Allergy & Precautions, NPO status , Patient's Chart, lab work & pertinent test results  Airway Mallampati: III  TM Distance: >3 FB Neck ROM: Full    Dental no notable dental hx.    Pulmonary pneumonia (h/o COVID pneumonia), resolved,    Pulmonary exam normal breath sounds clear to auscultation       Cardiovascular hypertension, Normal cardiovascular exam Rhythm:Regular Rate:Normal     Neuro/Psych negative neurological ROS  negative psych ROS   GI/Hepatic negative GI ROS, Neg liver ROS,   Endo/Other  diabetes, Gestational  Renal/GU negative Renal ROS  negative genitourinary   Musculoskeletal negative musculoskeletal ROS (+)   Abdominal   Peds negative pediatric ROS (+)  Hematology negative hematology ROS (+)   Anesthesia Other Findings   Reproductive/Obstetrics negative OB ROS                            Anesthesia Physical Anesthesia Plan  ASA: III  Anesthesia Plan: Spinal   Post-op Pain Management:    Induction:   PONV Risk Score and Plan: 2 and Treatment may vary due to age or medical condition  Airway Management Planned: Natural Airway  Additional Equipment: None  Intra-op Plan:   Post-operative Plan:   Informed Consent: I have reviewed the patients History and Physical, chart, labs and discussed the procedure including the risks, benefits and alternatives for the proposed anesthesia with the patient or authorized representative who has indicated his/her understanding and acceptance.       Plan Discussed with: Anesthesiologist  Anesthesia Plan Comments:         Anesthesia Quick Evaluation

## 2020-12-02 NOTE — Transfer of Care (Signed)
Immediate Anesthesia Transfer of Care Note  Patient: Haley Rodgers  Procedure(s) Performed: REPEAT CESAREAN SECTION (N/A )  Patient Location: PACU  Anesthesia Type:Spinal  Level of Consciousness: awake, alert  and oriented  Airway & Oxygen Therapy: Patient Spontanous Breathing  Post-op Assessment: Report given to RN and Post -op Vital signs reviewed and stable  Post vital signs: Reviewed and stable  Last Vitals:  Vitals Value Taken Time  BP 117/81 12/02/20 1600  Temp    Pulse 84 12/02/20 1601  Resp 26 12/02/20 1601  SpO2 100 % 12/02/20 1601  Vitals shown include unvalidated device data.  Last Pain:  Vitals:   12/02/20 1200  TempSrc: Oral  PainSc:          Complications: No complications documented.

## 2020-12-02 NOTE — Lactation Note (Signed)
This note was copied from a baby's chart. Lactation Consultation Note  Patient Name: Haley Rodgers [id number] Date: 12/02/2020 Reason for consult: Initial assessment;Other (Comment) (C/S delivery, mom with flat nipples.) Age:37 hours Per mom, infant had one void and one stool since birth. Mom made one attempt to latch infant in recovery but infant did not latch. Mom pre-pumped breast prior to latching infant with hand pump due to having flat nipples , infant did not latch was sleepy and reluctant at this time. Mom hand expressed and infant took 6 mls of  colostrum by spoon.  Mom will continue to work towards latching infant at the breast and will ask RN or [place] for latch assistance if needed.  Mom knows to breastfeed infant according to cues, 8 to 12 or more times within 24 hours, STS. Mom knows if infant isn't latching to hand express and give infant by volume and to do as much STS with infant as possible. Mom will wear breast shells in bra during the day and not at night nor while sleeping to help evert nipple shaft out more. LC discussed infant's input and out with parents. Mom made aware of O/P services, breastfeeding support groups, community resources, and our phone # for post-discharge questions.  Maternal Data Has patient been taught Hand Expression?: Yes Does the patient have breastfeeding experience prior to this delivery?: Yes How long did the patient breastfeed?: Per mom, she attempted to breastfeed her 1st child, she used NS but infant did not latch, she pumped and supplemented infant with formula for 4 months.  Feeding Mother's Current Feeding Choice: Breast Milk Nipple Type: Slow - flow  LATCH Score Latch: Too sleepy or reluctant, no latch achieved, no sucking elicited.  Audible Swallowing: None  Type of Nipple: Flat  Comfort (Breast/Nipple): Soft / non-tender  Hold (Positioning): Assistance needed to correctly position infant at breast and maintain latch.  LATCH  Score: 4   Lactation Tools Discussed/Used Tools: Shells;Pump Breast pump type: Manual Pump Education: Setup, frequency, and cleaning;Milk Storage Reason for Pumping: Mom will pre-pump breast prior to latching infant. Pumping frequency: pre-pump prior to latching infant due to having flat nipples  Interventions Interventions: Breast feeding basics reviewed;Assisted with latch;Skin to skin;Breast massage;Hand express;Pre-pump if needed;Breast compression;Adjust position;Support pillows;Position options;Expressed milk;Shells;Hand pump;Education  Discharge Pump: Manual;Personal WIC Program: No  Consult Status Consult Status: Follow-up Date: 12/03/20 Follow-up type: In-patient    Vicente Serene 12/02/2020, 7:39 PM

## 2020-12-03 ENCOUNTER — Encounter (HOSPITAL_COMMUNITY): Payer: Self-pay | Admitting: Obstetrics

## 2020-12-03 ENCOUNTER — Encounter (HOSPITAL_COMMUNITY)
Admission: RE | Admit: 2020-12-03 | Discharge: 2020-12-03 | Disposition: A | Discharge status: Home / Self Care | Payer: 59 | Source: Ambulatory Visit | Attending: Obstetrics | Admitting: Obstetrics

## 2020-12-03 ENCOUNTER — Other Ambulatory Visit (HOSPITAL_COMMUNITY): Payer: 59

## 2020-12-03 HISTORY — DX: Gestational (pregnancy-induced) hypertension without significant proteinuria, unspecified trimester: O13.9

## 2020-12-03 LAB — CBC
HCT: 24.4 % — ABNORMAL LOW (ref 36.0–46.0)
Hemoglobin: 8.1 g/dL — ABNORMAL LOW (ref 12.0–15.0)
MCH: 27.9 pg (ref 26.0–34.0)
MCHC: 33.2 g/dL (ref 30.0–36.0)
MCV: 84.1 fL (ref 80.0–100.0)
Platelets: 142 10*3/uL — ABNORMAL LOW (ref 150–400)
RBC: 2.9 MIL/uL — ABNORMAL LOW (ref 3.87–5.11)
RDW: 14.4 % (ref 11.5–15.5)
WBC: 8.1 10*3/uL (ref 4.0–10.5)
nRBC: 0 % (ref 0.0–0.2)

## 2020-12-03 LAB — RPR: RPR Ser Ql: NONREACTIVE

## 2020-12-03 LAB — GLUCOSE, CAPILLARY: Glucose-Capillary: 85 mg/dL (ref 70–99)

## 2020-12-03 MED ORDER — FERROUS GLUCONATE 324 (38 FE) MG PO TABS
324.0000 mg | ORAL_TABLET | Freq: Two times a day (BID) | ORAL | Status: DC
  Administered 2020-12-03 – 2020-12-04 (×2): 324 mg via ORAL
  Filled 2020-12-03 (×2): qty 1

## 2020-12-03 NOTE — Progress Notes (Signed)
Patient is eating, ambulating, voiding.  Pain control is good.  Appropriate lochia, no complaints.  Vitals:   12/02/20 2113 12/02/20 2244 12/03/20 0234 12/03/20 0615  BP: 122/62 114/62 118/68 108/64  Pulse: 71 69 61 67  Resp: 16 16 18 16   Temp: 97.8 F (36.6 C) 98 F (36.7 C) 98 F (36.7 C) 97.9 F (36.6 C)  TempSrc: Oral Oral Oral Oral  SpO2: 98% 97% 98% 96%    Fundus firm Inc: some bloody drainage RLQ aspect of dressing, will have RN change dressing and cont. To monitor Ext: no calf tenderness  Lab Results  Component Value Date   WBC 8.1 12/03/2020   HGB 8.1 (L) 12/03/2020   HCT 24.4 (L) 12/03/2020   MCV 84.1 12/03/2020   PLT 142 (L) 12/03/2020    --/--/O NEG (04/03 1259)  A/P Post op day #1 s/p repeat c/s. Doing well. GHTN- BPs wnl GDMA2- fasting blood sugar 85, insulin discontinued prior Acute blood loss anemia, Fe bid added Per central nursery, postpone circ d/t poor feeding.  Circ orders already placed.  Routine care.  Expect d/c 4/5.    Haley Rodgers

## 2020-12-03 NOTE — Lactation Note (Signed)
This note was copied from a baby's chart. Lactation Consultation Note Baby 85 hrs old.  Mom giving formula when LC entered rm.  Mom stated that the baby is to sleepy at the breast to feed. Mom stated she is pumping and giving the colostrum that she get, some times it's drops and other time its a tea spoon.  Asked mom if she has received her DEBP for being [employer] employee yet, mom stated no. Mom chose [employer]. LC gave mom employee DEBP and made copies of insurance information.  Asked mom to call for [facility] assistance for next feeding to latch.  Patient Name: Haley Rodgers [id number] Date: 12/03/2020 Reason for consult: Follow-up assessment;Maternal endocrine disorder;Early term 37-38.6wks Age:37 hours  Maternal Data    Feeding Mother's Current Feeding Choice: Breast Milk and Formula Nipple Type: Slow - flow  LATCH Score Latch: Too sleepy or reluctant, no latch achieved, no sucking elicited.  Audible Swallowing: None  Type of Nipple: Flat  Comfort (Breast/Nipple): Soft / non-tender  Hold (Positioning): No assistance needed to correctly position infant at breast.  LATCH Score: 5   Lactation Tools Discussed/Used Tools: Pump Breast pump type: Double-Electric Breast Pump Reason for Pumping:  (Infant still reluctant to latch; very sleepy) Pumping frequency:  (8 times in 24 hours)  Interventions Interventions: DEBP  Discharge    Consult Status Consult Status: Follow-up Date: 12/04/20 Follow-up type: In-patient    Theodoro Kalata 12/03/2020, 9:20 PM

## 2020-12-04 ENCOUNTER — Other Ambulatory Visit (HOSPITAL_COMMUNITY): Payer: Self-pay

## 2020-12-04 MED ORDER — OXYCODONE-ACETAMINOPHEN 5-325 MG PO TABS
1.0000 | ORAL_TABLET | ORAL | 0 refills | Status: DC | PRN
  Filled 2020-12-04: qty 30, 5d supply, fill #0

## 2020-12-04 NOTE — Lactation Note (Signed)
This note was copied from a baby's chart. Lactation Consultation Note  Patient Name: [name] [id number] Date: 12/04/2020   Age:37 hours  Visitors in room.  Mom requested nipple shield.  Gave mom 20 mm nipple shield based on information she provided to me.  Mom reports she may just end up pumping and bottle feeding because he is so sleepy. Discussed early term infants briefly and how many times can make them more sleepy.   Just returned from circumcision as well. Urged her to follow up with outpatient lactation  Maternal Data    Feeding    LATCH Score                    Lactation Tools Discussed/Used    Interventions    Discharge    Consult Status      [name] 12/04/2020, 11:20 AM

## 2020-12-04 NOTE — Discharge Summary (Signed)
Postpartum Discharge Summary  Date of Service updated     Patient Name: Haley Rodgers DOB: 1986/08/27 MRN: [id number]  Date of admission: 12/02/2020 Delivery date:12/02/2020  Delivering provider: Vanessa Kick  Date of discharge: 12/04/2020  Admitting diagnosis: Pregnancy [Z34.90] Cesarean delivery, delivered, current hospitalization [O82] Intrauterine pregnancy: [redacted]w[redacted]d     Secondary diagnosis:  Active Problems:   Cesarean delivery, delivered, current hospitalization  Additional problems: GDMA2, insulin    Discharge diagnosis: GDM A2                                              Post partum procedures:no Rhogam needed Augmentation: N/A Complications: None  Hospital course: Onset of Labor With Unplanned C/S   35 y.o. yo [id number] at [redacted]w[redacted]d was admitted in Latent Labor on 12/02/2020. Patient had a labor course significant for ROM. The patient went for cesarean section due to Elective Repeat. Delivery details as follows: Membrane Rupture Time/Date: 8:00 AM ,12/02/2020   Delivery Method:C-Section, Low Transverse  Details of operation can be found in separate operative note. Patient had an uncomplicated postpartum course.  She is ambulating,tolerating a regular diet, passing flatus, and urinating well.  Patient is discharged home in stable condition 12/04/20.  Newborn Data: Birth date:12/02/2020  Birth time:3:16 PM  Gender:Female  Living status:Living  Apgars:8 ,9  Weight:3099 g   Magnesium Sulfate received: No BMZ received: No Rhophylac:No MMR:No T-DaP:Given prenatally Flu: No Transfusion:No  Physical exam  Vitals:   12/03/20 1028 12/03/20 1312 12/03/20 2100 12/04/20 0528  BP: 116/71 122/72 113/78 123/84  Pulse: 66 65 69 77  Resp:  $Remo'17 18 16  'IHbIh$ Temp: 98.1 F (36.7 C) 97.9 F (36.6 C) 98.4 F (36.9 C) 98 F (36.7 C)  TempSrc: Oral Oral Oral Oral  SpO2:  100%  98%    Labs: Lab Results  Component Value Date   WBC 8.1 12/03/2020   HGB 8.1 (L) 12/03/2020   HCT 24.4 (L)  12/03/2020   MCV 84.1 12/03/2020   PLT 142 (L) 12/03/2020   CMP Latest Ref Rng & Units 08/22/2020  Glucose 70 - 99 mg/dL 103(H)  BUN 6 - 20 mg/dL 6  Creatinine 0.44 - 1.00 mg/dL 0.32(L)  Sodium 135 - 145 mmol/L 137  Potassium 3.5 - 5.1 mmol/L 3.5  Chloride 98 - 111 mmol/L 104  CO2 22 - 32 mmol/L 20(L)  Calcium 8.9 - 10.3 mg/dL 8.1(L)  Total Protein 6.5 - 8.1 g/dL 5.8(L)  Total Bilirubin 0.3 - 1.2 mg/dL 0.7  Alkaline Phos 38 - 126 U/L 108  AST 15 - 41 U/L 41  ALT 0 - 44 U/L 62(H)   Flavia Shipper Score: Edinburgh Postnatal Depression Scale Screening Tool 03/02/2018  I have been able to laugh and see the funny side of things. 0  I have looked forward with enjoyment to things. 0  I have blamed myself unnecessarily when things went wrong. 0  I have been anxious or worried for no good reason. 0  I have felt scared or panicky for no good reason. 0  Things have been getting on top of me. 0  I have been so unhappy that I have had difficulty sleeping. 0  I have felt sad or miserable. 0  I have been so unhappy that I have been crying. 0  The thought of harming myself has occurred to me. 0  Edinburgh Postnatal Depression Scale Total 0      After visit meds:  Allergies as of 12/04/2020   No Known Allergies     Medication List    STOP taking these medications   aspirin EC 81 MG tablet   dexamethasone 2 MG tablet Commonly known as: DECADRON   HumuLIN N KwikPen 100 UNIT/ML Kiwkpen Generic drug: Insulin NPH (Human) (Isophane)   insulin NPH Human 100 UNIT/ML injection Commonly known as: NOVOLIN N   Unifine Pentips 32G X 4 MM Misc Generic drug: Insulin Pen Needle     TAKE these medications   ascorbic acid 500 MG tablet Commonly known as: VITAMIN C Take 1 tablet (500 mg total) by mouth daily.   freestyle lancets USE AS DIRECTED TO TEST BLOOD SUGAR 4 TIMES DAILY   FREESTYLE LITE test strip Generic drug: glucose blood CHECK BLOOD SUGAR 4 TIMES DAILY   oxyCODONE-acetaminophen  5-325 MG tablet Commonly known as: PERCOCET/ROXICET Take 1 tablet by mouth every 4 (four) hours as needed for severe pain.   prenatal multivitamin Tabs tablet Take 1 tablet by mouth daily at 12 noon.   zinc sulfate 220 (50 Zn) MG capsule Take 1 capsule (220 mg total) by mouth daily.        Discharge home in stable condition Infant Feeding: ? Infant Disposition:home with mother Discharge instruction: per After Visit Summary and Postpartum booklet. Activity: Advance as tolerated. Pelvic rest for 6 weeks.  Diet: carb modified diet Anticipated Birth Control: Unsure Postpartum Appointment:4 weeks Additional Postpartum F/U: none Future Appointments:No future appointments. Follow up Visit:  Follow-up Information    Vanessa Kick, MD Follow up in 4 week(s).   Specialty: Obstetrics and Gynecology Contact information: [place] [address] [phone]                   12/04/2020 Daria Pastures, MD

## 2020-12-04 NOTE — Progress Notes (Signed)
  Patient is eating, ambulating, voiding.  Pain control is good.  Vitals:   12/03/20 1028 12/03/20 1312 12/03/20 2100 12/04/20 0528  BP: 116/71 122/72 113/78 123/84  Pulse: 66 65 69 77  Resp:  17 18 16   Temp: 98.1 F (36.7 C) 97.9 F (36.6 C) 98.4 F (36.9 C) 98 F (36.7 C)  TempSrc: Oral Oral Oral Oral  SpO2:  100%  98%    lungs:   clear to auscultation cor:    RRR Abdomen:  soft, appropriate tenderness, incisions intact and without erythema or exudate ex:    no cords   Lab Results  Component Value Date   WBC 8.1 12/03/2020   HGB 8.1 (L) 12/03/2020   HCT 24.4 (L) 12/03/2020   MCV 84.1 12/03/2020   PLT 142 (L) 12/03/2020    --/--/O NEG (04/03 1259)/RI  A/P    Post operative day 2.  Routine post op and postpartum care.  Expect d/c later today.  Percocet for pain control.  Iron for anemia.   Baby was O neg- no need for Rhogam.

## 2020-12-07 ENCOUNTER — Ambulatory Visit: Payer: 59

## 2020-12-12 ENCOUNTER — Other Ambulatory Visit (HOSPITAL_COMMUNITY): Payer: Self-pay

## 2020-12-13 ENCOUNTER — Ambulatory Visit: Payer: 59

## 2020-12-18 DIAGNOSIS — H6692 Otitis media, unspecified, left ear: Secondary | ICD-10-CM | POA: Diagnosis not present

## 2020-12-23 ENCOUNTER — Inpatient Hospital Stay (HOSPITAL_COMMUNITY): Admit: 2020-12-23 | Payer: Self-pay

## 2021-01-04 DIAGNOSIS — D649 Anemia, unspecified: Secondary | ICD-10-CM | POA: Diagnosis not present

## 2021-01-04 DIAGNOSIS — E663 Overweight: Secondary | ICD-10-CM | POA: Diagnosis not present

## 2021-01-04 DIAGNOSIS — E7800 Pure hypercholesterolemia, unspecified: Secondary | ICD-10-CM | POA: Diagnosis not present

## 2021-01-07 DIAGNOSIS — Z0000 Encounter for general adult medical examination without abnormal findings: Secondary | ICD-10-CM | POA: Diagnosis not present

## 2021-01-07 DIAGNOSIS — R82998 Other abnormal findings in urine: Secondary | ICD-10-CM | POA: Diagnosis not present

## 2021-01-07 DIAGNOSIS — Z1389 Encounter for screening for other disorder: Secondary | ICD-10-CM | POA: Diagnosis not present

## 2021-01-07 DIAGNOSIS — Z1331 Encounter for screening for depression: Secondary | ICD-10-CM | POA: Diagnosis not present

## 2021-01-09 DIAGNOSIS — H9122 Sudden idiopathic hearing loss, left ear: Secondary | ICD-10-CM | POA: Diagnosis not present

## 2021-01-09 DIAGNOSIS — H90A22 Sensorineural hearing loss, unilateral, left ear, with restricted hearing on the contralateral side: Secondary | ICD-10-CM | POA: Diagnosis not present

## 2021-01-23 ENCOUNTER — Other Ambulatory Visit: Payer: Self-pay | Admitting: Otolaryngology

## 2021-01-23 DIAGNOSIS — H9122 Sudden idiopathic hearing loss, left ear: Secondary | ICD-10-CM | POA: Diagnosis not present

## 2021-01-23 DIAGNOSIS — H90A22 Sensorineural hearing loss, unilateral, left ear, with restricted hearing on the contralateral side: Secondary | ICD-10-CM | POA: Diagnosis not present

## 2021-02-05 ENCOUNTER — Ambulatory Visit
Admission: RE | Admit: 2021-02-05 | Discharge: 2021-02-05 | Disposition: A | Discharge status: Home / Self Care | Payer: 59 | Source: Ambulatory Visit | Attending: Otolaryngology | Admitting: Otolaryngology

## 2021-02-05 ENCOUNTER — Other Ambulatory Visit: Payer: Self-pay

## 2021-02-05 DIAGNOSIS — H9122 Sudden idiopathic hearing loss, left ear: Secondary | ICD-10-CM

## 2021-02-05 DIAGNOSIS — H9190 Unspecified hearing loss, unspecified ear: Secondary | ICD-10-CM | POA: Diagnosis not present

## 2021-02-05 MED ORDER — GADOBUTROL 1 MMOL/ML IV SOLN
7.5000 mL | Freq: Once | INTRAVENOUS | Status: AC | PRN
  Administered 2021-02-05: 7.5 mL via INTRAVENOUS

## 2021-02-11 DIAGNOSIS — H9122 Sudden idiopathic hearing loss, left ear: Secondary | ICD-10-CM | POA: Diagnosis not present

## 2021-02-18 DIAGNOSIS — H9122 Sudden idiopathic hearing loss, left ear: Secondary | ICD-10-CM | POA: Diagnosis not present

## 2021-02-28 DIAGNOSIS — H9122 Sudden idiopathic hearing loss, left ear: Secondary | ICD-10-CM | POA: Diagnosis not present

## 2021-03-03 DIAGNOSIS — J039 Acute tonsillitis, unspecified: Secondary | ICD-10-CM | POA: Diagnosis not present

## 2021-03-13 DIAGNOSIS — H903 Sensorineural hearing loss, bilateral: Secondary | ICD-10-CM | POA: Diagnosis not present

## 2021-03-13 DIAGNOSIS — H9123 Sudden idiopathic hearing loss, bilateral: Secondary | ICD-10-CM | POA: Diagnosis not present

## 2021-03-18 DIAGNOSIS — H903 Sensorineural hearing loss, bilateral: Secondary | ICD-10-CM | POA: Diagnosis not present

## 2021-03-18 DIAGNOSIS — Z0110 Encounter for examination of ears and hearing without abnormal findings: Secondary | ICD-10-CM | POA: Diagnosis not present

## 2021-03-25 ENCOUNTER — Inpatient Hospital Stay: Payer: 59

## 2021-03-25 ENCOUNTER — Other Ambulatory Visit: Payer: Self-pay

## 2021-03-25 ENCOUNTER — Inpatient Hospital Stay: Payer: 59 | Attending: Oncology | Admitting: Oncology

## 2021-03-25 VITALS — BP 116/84 | HR 93 | Temp 98.7°F | Resp 18

## 2021-03-25 DIAGNOSIS — D508 Other iron deficiency anemias: Secondary | ICD-10-CM

## 2021-03-25 DIAGNOSIS — Z79899 Other long term (current) drug therapy: Secondary | ICD-10-CM | POA: Insufficient documentation

## 2021-03-25 DIAGNOSIS — Z8616 Personal history of COVID-19: Secondary | ICD-10-CM | POA: Diagnosis not present

## 2021-03-25 DIAGNOSIS — H903 Sensorineural hearing loss, bilateral: Secondary | ICD-10-CM | POA: Diagnosis not present

## 2021-03-25 LAB — COMPREHENSIVE METABOLIC PANEL
ALT: 30 U/L (ref 0–44)
AST: 27 U/L (ref 15–41)
Albumin: 4.8 g/dL (ref 3.5–5.0)
Alkaline Phosphatase: 93 U/L (ref 38–126)
Anion gap: 10 (ref 5–15)
BUN: 15 mg/dL (ref 6–20)
CO2: 29 mmol/L (ref 22–32)
Calcium: 9.8 mg/dL (ref 8.9–10.3)
Chloride: 97 mmol/L — ABNORMAL LOW (ref 98–111)
Creatinine, Ser: 0.81 mg/dL (ref 0.44–1.00)
GFR, Estimated: >60 mL/min (ref >60)
Glucose, Bld: 110 mg/dL — ABNORMAL HIGH (ref 70–99)
Potassium: 3.6 mmol/L (ref 3.5–5.1)
Sodium: 136 mmol/L (ref 135–145)
Total Bilirubin: 0.6 mg/dL (ref 0.3–1.2)
Total Protein: 9.1 g/dL — ABNORMAL HIGH (ref 6.5–8.1)

## 2021-03-25 LAB — RETIC PANEL
Immature Retic Fract: 6.2 % (ref 2.3–15.9)
RBC.: 4.64 MIL/uL (ref 3.87–5.11)
Retic Count, Absolute: 67.3 10*3/uL (ref 19.0–186.0)
Retic Ct Pct: 1.5 % (ref 0.4–3.1)
Reticulocyte Hemoglobin: 32.9 pg (ref >27.9)

## 2021-03-25 LAB — IRON AND TIBC
Iron: 82 ug/dL (ref 28–170)
Saturation Ratios: 21 % (ref 10.4–31.8)
TIBC: 396 ug/dL (ref 250–450)
UIBC: 314 ug/dL

## 2021-03-25 LAB — CBC WITH DIFFERENTIAL/PLATELET
Abs Immature Granulocytes: 0.06 10*3/uL (ref 0.00–0.07)
Basophils Absolute: 0.1 10*3/uL (ref 0.0–0.1)
Basophils Relative: 1 %
Eosinophils Absolute: 0.1 10*3/uL (ref 0.0–0.5)
Eosinophils Relative: 2 %
HCT: 39 % (ref 36.0–46.0)
Hemoglobin: 12.9 g/dL (ref 12.0–15.0)
Immature Granulocytes: 1 %
Lymphocytes Relative: 28 %
Lymphs Abs: 2.1 10*3/uL (ref 0.7–4.0)
MCH: 27.9 pg (ref 26.0–34.0)
MCHC: 33.1 g/dL (ref 30.0–36.0)
MCV: 84.4 fL (ref 80.0–100.0)
Monocytes Absolute: 0.6 10*3/uL (ref 0.1–1.0)
Monocytes Relative: 9 %
Neutro Abs: 4.4 10*3/uL (ref 1.7–7.7)
Neutrophils Relative %: 59 %
Platelets: 414 10*3/uL — ABNORMAL HIGH (ref 150–400)
RBC: 4.62 MIL/uL (ref 3.87–5.11)
RDW: 14.5 % (ref 11.5–15.5)
WBC: 7.4 10*3/uL (ref 4.0–10.5)
nRBC: 0 % (ref 0.0–0.2)

## 2021-03-25 LAB — FERRITIN: Ferritin: 47 ng/mL (ref 11–307)

## 2021-03-25 NOTE — Progress Notes (Signed)
Hematology/Oncology Consult note [facility] [phone] [phone]   Patient Care Team: Tisovec, Fransico Him, MD as PCP - General (Internal Medicine)  REFERRING PROVIDER: Tisovec, Fransico Him, MD  CHIEF COMPLAINTS/REASON FOR VISIT:  Evaluation of anemia  HISTORY OF PRESENTING ILLNESS:   Haley Rodgers is a  35 y.o.  female with PMH listed below was seen in consultation at the request of  Tisovec, Fransico Him, MD  for evaluation of anemia.   Patient had COVID 19 infection during her last pregnancy. She developed hearing loss and follows up with ENT Dr.Vaught. She was breast feeding until a week ago. She started on diuretics.  Feel fatigued.  Period has resumed, light flow.   Review of Systems  Constitutional:  Positive for fatigue. Negative for appetite change, chills and fever.  HENT:   Negative for hearing loss and voice change.   Eyes:  Negative for eye problems.  Respiratory:  Negative for chest tightness and cough.   Cardiovascular:  Negative for chest pain.  Gastrointestinal:  Negative for abdominal distention, abdominal pain and blood in stool.  Endocrine: Negative for hot flashes.  Genitourinary:  Negative for difficulty urinating and frequency.   Musculoskeletal:  Negative for arthralgias.  Skin:  Negative for itching and rash.  Neurological:  Negative for extremity weakness.  Hematological:  Negative for adenopathy.  Psychiatric/Behavioral:  Negative for confusion.    MEDICAL HISTORY:  Past Medical History:  Diagnosis Date   Gestational diabetes    Pregnancy induced hypertension     SURGICAL HISTORY: Past Surgical History:  Procedure Laterality Date   CESAREAN SECTION N/A 03/01/2018   Procedure: CESAREAN SECTION;  Surgeon: Olga Millers, MD;  Location: [place];  Service: Obstetrics;  Laterality: N/A;   CESAREAN SECTION N/A 12/02/2020   Procedure: REPEAT CESAREAN SECTION;  Surgeon: Jerelyn Charles, MD;   Location: [place] [facility]S;  Service: Obstetrics;  Laterality: N/A;   WISDOM TOOTH EXTRACTION      SOCIAL HISTORY: Social History   Socioeconomic History   Marital status: Married    Spouse name: Not on file   Number of children: Not on file   Years of education: Not on file   Highest education level: Not on file  Occupational History   Not on file  Tobacco Use   Smoking status: Never   Smokeless tobacco: Never  Vaping Use   Vaping Use: Never used  Substance and Sexual Activity   Alcohol use: Not Currently   Drug use: Never   Sexual activity: Yes    Birth control/protection: None  Other Topics Concern   Not on file  Social History Narrative   Not on file   Social Determinants of Health   Financial Resource Strain: Not on file  Food Insecurity: Not on file  Transportation Needs: Not on file  Physical Activity: Not on file  Stress: Not on file  Social Connections: Not on file  Intimate Partner Violence: Not on file    FAMILY HISTORY: Family History  Problem Relation Age of Onset   Cancer Father    Diabetes Father    Hyperlipidemia Maternal Grandmother    Cancer Maternal Grandfather    Hyperlipidemia Maternal Grandfather     ALLERGIES:  has No Known Allergies.  MEDICATIONS:  Current Outpatient Medications  Medication Sig Dispense Refill   hydrochlorothiazide (HYDRODIURIL) 25 MG tablet Take 25 mg by mouth daily.     No current facility-administered medications for this visit.  PHYSICAL EXAMINATION: ECOG PERFORMANCE STATUS: 0 - Asymptomatic Vitals:   03/25/21 1546  BP: 116/84  Pulse: 93  Resp: 18  Temp: 98.7 F (37.1 C)  SpO2: 99%   There were no vitals filed for this visit.  Physical Exam Constitutional:      General: She is not in acute distress. HENT:     Head: Normocephalic and atraumatic.  Eyes:     General: No scleral icterus. Cardiovascular:     Rate and Rhythm: Normal rate and regular rhythm.     Heart sounds: Normal heart sounds.   Pulmonary:     Effort: Pulmonary effort is normal. No respiratory distress.     Breath sounds: No wheezing.  Abdominal:     General: Bowel sounds are normal. There is no distension.     Palpations: Abdomen is soft.  Musculoskeletal:        General: No deformity. Normal range of motion.     Cervical back: Normal range of motion and neck supple.  Skin:    General: Skin is warm and dry.     Findings: No erythema or rash.  Neurological:     Mental Status: She is alert and oriented to person, place, and time. Mental status is at baseline.     Cranial Nerves: No cranial nerve deficit.     Coordination: Coordination normal.  Psychiatric:        Mood and Affect: Mood normal.    LABORATORY DATA:  I have reviewed the data as listed Lab Results  Component Value Date   WBC 7.4 03/25/2021   HGB 12.9 03/25/2021   HCT 39.0 03/25/2021   MCV 84.4 03/25/2021   PLT 414 (H) 03/25/2021   Recent Labs    08/21/20 0812 08/22/20 0742 03/25/21 1619  NA 137 137 136  K 4.0 3.5 3.6  CL 104 104 97*  CO2 23 20* 29  GLUCOSE 107* 103* 110*  BUN 5* 6 15  CREATININE 0.44 0.32* 0.81  CALCIUM 8.2* 8.1* 9.8  GFRNONAA >60 >60 >60  PROT 6.2* 5.8* 9.1*  ALBUMIN 2.4* 2.4* 4.8  AST 69* 41 27  ALT 78* 62* 30  ALKPHOS 122 108 93  BILITOT 0.9 0.7 0.6   Iron/TIBC/Ferritin/ %Sat    Component Value Date/Time   IRON 82 03/25/2021 1619   TIBC 396 03/25/2021 1619   FERRITIN 47 03/25/2021 1619   IRONPCTSAT 21 03/25/2021 1619      RADIOGRAPHIC STUDIES: I have personally reviewed the radiological images as listed and agreed with the findings in the report. MR BRAIN/IAC W WO CONTRAST  Result Date: 02/05/2021 CLINICAL DATA:  Bilateral hearing loss over the last 2 months following childbirth. EXAM: MRI HEAD WITHOUT AND WITH CONTRAST TECHNIQUE: Multiplanar, multiecho pulse sequences of the brain and surrounding structures were obtained without and with intravenous contrast. CONTRAST:  7.25m GADAVIST  GADOBUTROL 1 MMOL/ML IV SOLN COMPARISON:  None. FINDINGS: Brain: Diffusion imaging does not show any acute or subacute infarction. Brain parenchyma itself appears normal without old or acute infarction, mass lesion, hemorrhage, hydrocephalus or extra-axial collection. After contrast administration, no abnormal enhancement occurs. No MR finding of intracranial hypertension or hypotension. CP angle regions appear normal. Seventh and eighth nerve complexes are normal. No vestibular schwannoma or enhancing neuritis. No abnormal brain or leptomeningeal enhancement elsewhere. Vascular: Major vessels at the base of the brain show flow. Skull and upper cervical spine: Negative Sinuses/Orbits: Few areas of sinus mucosal thickening and opacification, most notable in the right maxillary and  ethmoid region. Other: None IMPRESSION: Normal appearance of the brain itself. No abnormality seen to explain hearing loss. Some degree of rhinosinusitis of the right maxillary and ethmoid sinuses. Electronically Signed   By: Nelson Chimes M.D.   On: 02/05/2021 12:37       ASSESSMENT & PLAN:  1. Other iron deficiency anemia    Previous labs were reviewed. Hemoglobin was 12.6. decreased iron saturation.  Recommend repeat cbc, cmp iron tibc ferritin.  Discussed about possible treatment option, oral iron supplementation vs IV venofer treatment.  Patient prefers to try oral iron supplementation first.   Lab results are available after her visit.  Ferritin is at 47, iron saturation at 21, hemoglobin is 12.9. no significant iron deficiency.  No need to take iron supplementation.   Orders Placed This Encounter  Procedures   Iron and TIBC    Standing Status:   Future    Number of Occurrences:   1    Standing Expiration Date:   03/25/2022   Ferritin    Standing Status:   Future    Number of Occurrences:   1    Standing Expiration Date:   09/25/2021   CBC with Differential/Platelet    Standing Status:   Future    Number of  Occurrences:   1    Standing Expiration Date:   03/25/2022   Retic Panel    Standing Status:   Future    Number of Occurrences:   1    Standing Expiration Date:   03/25/2022   Comprehensive metabolic panel    Standing Status:   Future    Number of Occurrences:   1    Standing Expiration Date:   03/25/2022    All questions were answered. The patient knows to call the clinic with any problems questions or concerns.  cc Tisovec, Fransico Him, MD    Return of visit: no need to follow up for now.   Thank you for this kind referral and the opportunity to participate in the care of this patient. A copy of today's note is routed to referring provider    Earlie Server, MD, PhD Hematology Oncology [facility] Pager- [phone] 03/25/2021

## 2021-03-26 ENCOUNTER — Telehealth: Payer: Self-pay

## 2021-03-26 NOTE — Telephone Encounter (Signed)
-----   Message from Earlie Server, MD sent at 03/25/2021 10:24 PM EDT ----- Please let her know that today's blood work showed normal hemoglobin, and normal iron panel. No signs of iron deficiency.no need for iron supplementation currently.  She probably has recovered very well post partum. No need for followup

## 2021-03-26 NOTE — Telephone Encounter (Signed)
Pt notified via mychart

## 2021-05-13 DIAGNOSIS — H5213 Myopia, bilateral: Secondary | ICD-10-CM | POA: Diagnosis not present

## 2021-05-31 DIAGNOSIS — H903 Sensorineural hearing loss, bilateral: Secondary | ICD-10-CM | POA: Diagnosis not present

## 2021-06-03 DIAGNOSIS — H903 Sensorineural hearing loss, bilateral: Secondary | ICD-10-CM | POA: Diagnosis not present

## 2021-06-03 IMAGING — US US MFM OB COMP +14 WKS
1 series · 13 of 28 positions shown · non-contrast
Comparison: none

[Series 1: us mfm ob comp +14 wks · 13 of 99 slices shown]
[im 4/99]
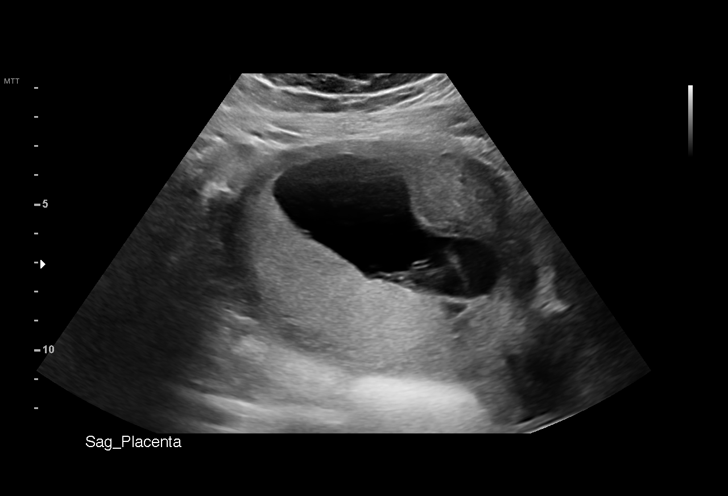
[im 11/99]
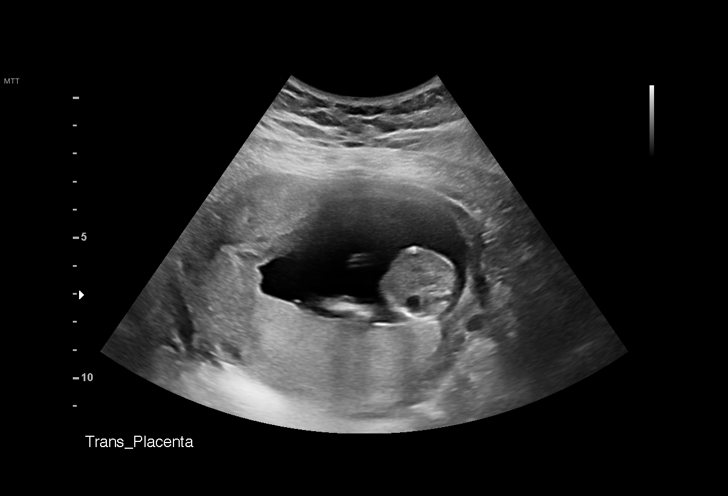
[im 19/99]
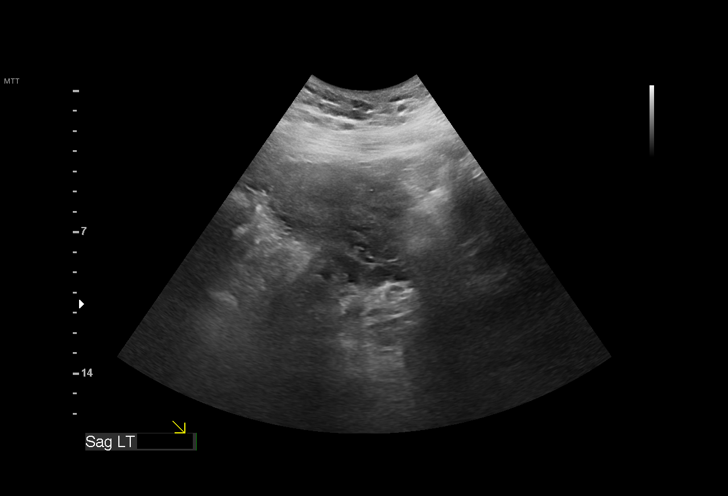
[im 26/99]
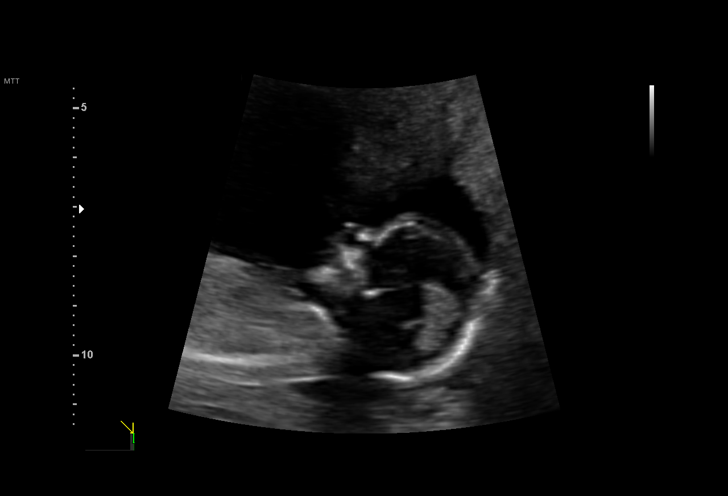
[im 33/99]
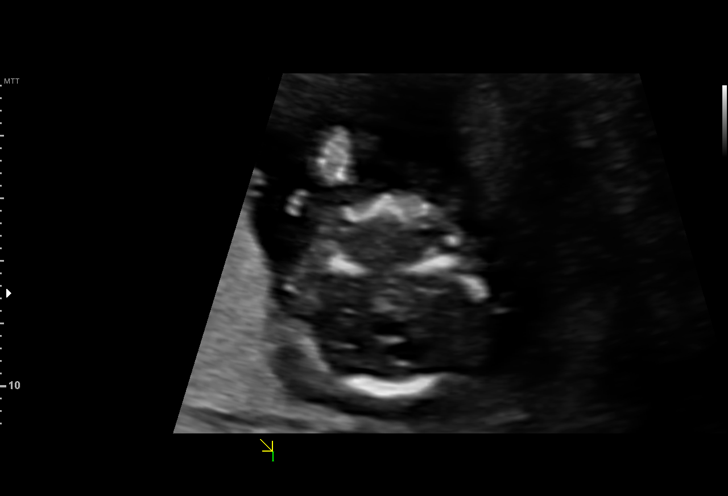
[im 40/99]
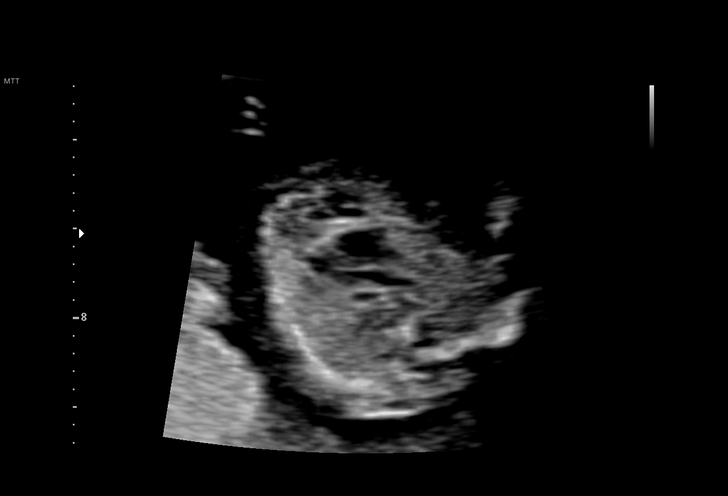
[im 51/99]
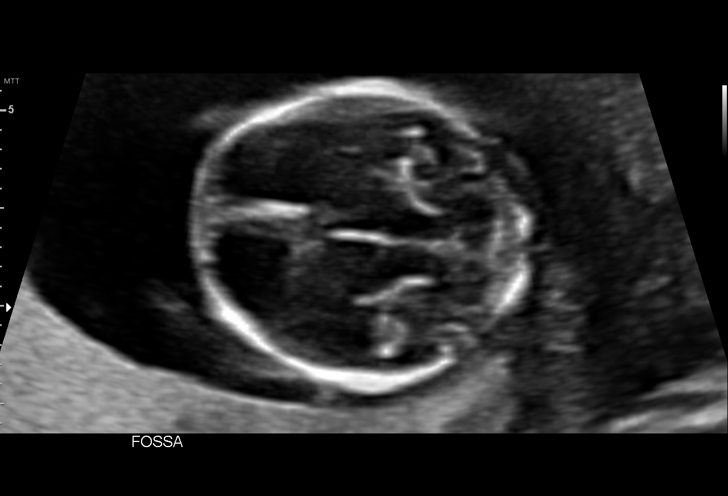
[im 59/99]
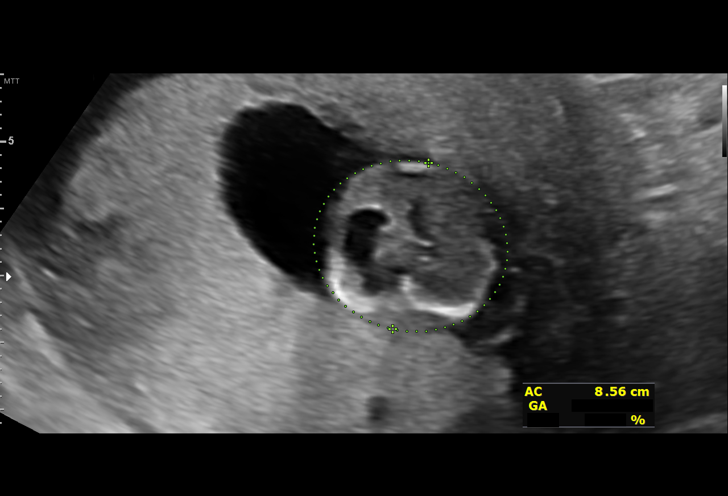
[im 66/99]
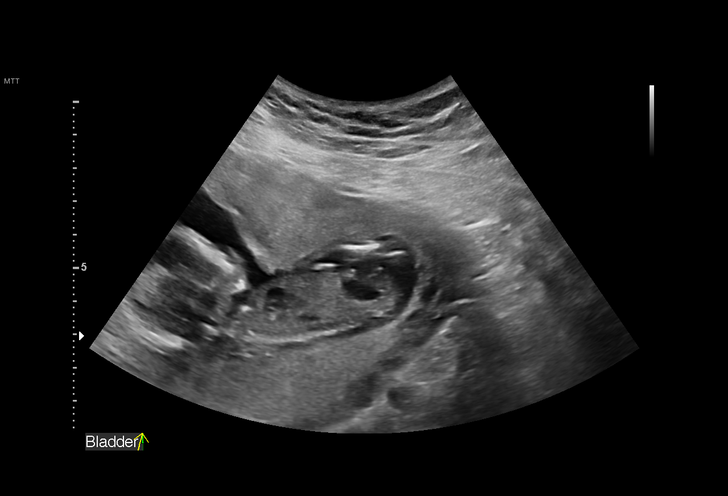
[im 73/99]
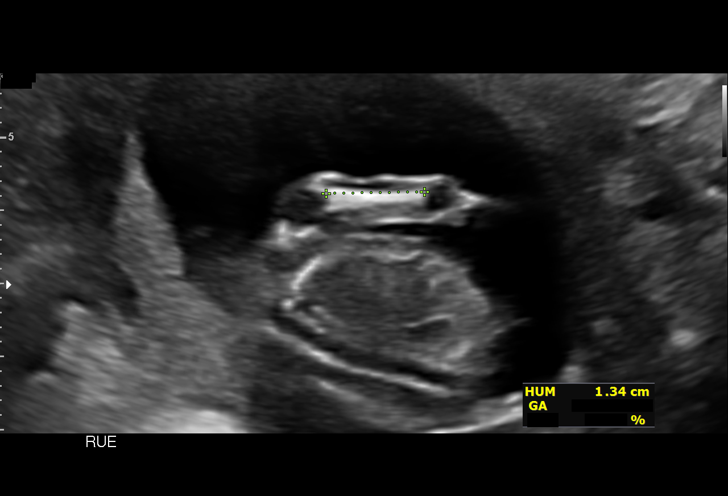
[im 80/99]
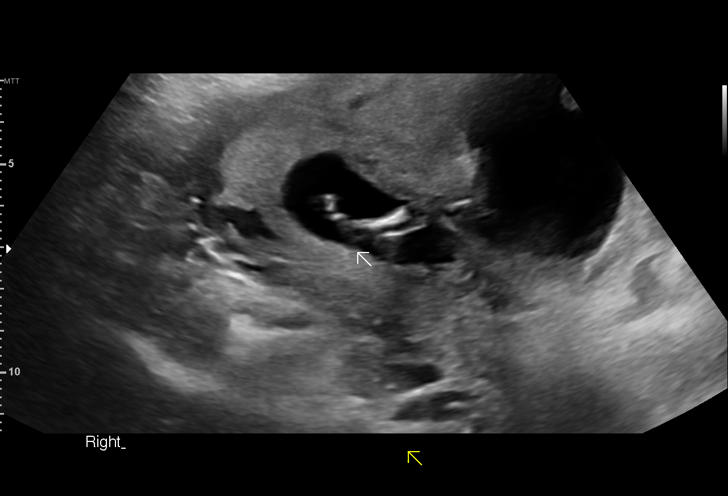
[im 88/99]
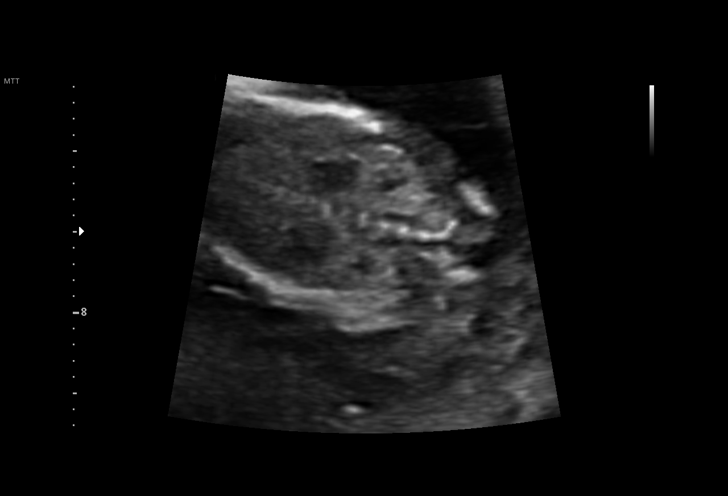
[im 95/99]
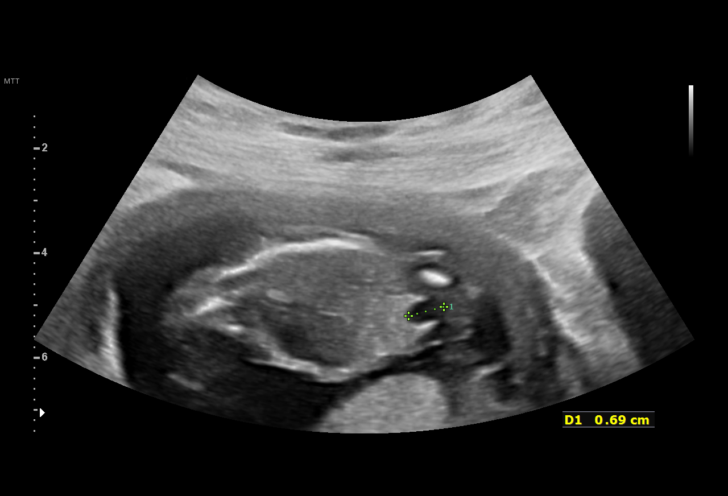

[13 of 28 positions shown; findings below may reference images not displayed]

OBGYN

 1  US MFM OB COMP + 14 WK                76805.01    DARCI VITA

Indications

 14 weeks gestation of pregnancy
 Abnormal fetal ultrasound
 Pregnancy resulting from assisted
 reproductive technology
 Poor obstetric history: Previous gestational
 diabetes
 History of cesarean delivery, currently
 pregnant
 Advanced maternal age multigravida 35+,
 second trimester
 Encounter for antenatal screening for
 malformations
Fetal Evaluation

 Num Of Fetuses:         1
 Fetal Heart Rate(bpm):  157
 Cardiac Activity:       Observed
 Presentation:           Variable
 Placenta:               Posterior
 P. Cord Insertion:      Not well visualized
 Amniotic Fluid
 AFI FV:      Within normal limits

                             Largest Pocket(cm)

Biometry

 BPD:      28.9  mm     G. Age:  15w 2d                  CI:        79.84   %    70 - 86
                                                         FL/HC:      13.7   %    15.3 -
 HC:      102.2  mm     G. Age:  14w 6d                  HC/AC:      1.23        1.05 -
 AC:       83.2  mm     G. Age:  14w 5d                  FL/BPD:     48.4   %
 FL:         14  mm     G. Age:  14w 1d                  FL/AC:      16.8   %    20 - 24
 HUM:      13.5  mm     G. Age:  13w 5d

 Est. FW:      98  gm      0 lb 3 oz
OB History

 Gravidity:    5         Term:   1        Prem:   0        SAB:   3
 TOP:          0       Ectopic:  0        Living: 1
Gestational Age

 LMP:           14w 5d        Date:  03/18/20                 EDD:   12/23/20
 U/S Today:     14w 5d                                        EDD:   12/23/20
 Best:          14w 5d     Det. By:  LMP  (03/18/20)          EDD:   12/23/20
Anatomy

 Cranium:               Appears normal         Cord Vessels:           Appears normal (3
                                                                       vessel cord)
 Choroid Plexus:        Appears normal         Kidneys:                Visualized
 Face:                  Visualized             Bladder:                Megacystis
 Heart:                 Visualized             Upper Extremities:      Visualized
 Stomach:               Appears normal, left   Lower Extremities:      Visualized
                        sided
 Abdomen:               Visualized

 Other:  Technically difficult due to early gestational age.
Cervix Uterus Adnexa

 Cervix
 Normal appearance by transabdominal scan.

 Uterus
 No abnormality visualized.

 Right Ovary
 Not visualized. No adnexal mass visualized.

 Left Ovary
 Not visualized. No adnexal mass visualized.

 Cul De Sac
 No free fluid seen.
 Adnexa
 No abnormality visualized.
Impression

 Single intrauterine pregnancy with measurements consistent
 with dates.
 Ms. Ceejay is being seen due to a concern for an enlarged
 bladder suggestive of megacystic with a bladder size >7 mm.

 Anatomy was precluded due to early gestational age.

 I reviewed today's imaging and we observed longitudinal
 bladder sizes from 6.8 mm to 11 mm. I reviewed that in many
 cases when isolated the prognosis is good. The differential
 diagnosis especially in a male fetus includes posterior
 Lmaki valves, uretheral stenosis/atresia, aneuploidy, and
 Denni belly. I shared that in most studies uretheral
 stenosis/atresia is a common diagnosis when megacystis is
 seen in the first trimester.

 Ms. Ceejay had a low risk NIPS and negative horizon result.
 We discussed the future role diagnostic testing
 (ex.karyotype) as well as the risk and benefits of such
 testing.  She and her significant other shared that she would
 not change her management of the pregnancy given these
 challenges. We breifly discussed the risk of oligohydramnios
 and the development of pulmonary hypoplasia as sequelae
 most influcing the outcome in cases of megacystis. We
 reviewed that I did not observe any other markers of
 aneuploidy today.

 We have scheduled Ms. Ceejay to follow up for a detailed
 examination in 4 weeks.
Recommendations

 Detailed anatomy was scheduled in 4 weeks.

## 2021-07-24 IMAGING — CT CT ANGIO CHEST
2 of 6 series · 17 of 46 positions shown · IV contrast (omnipaque)
Comparison: None.

CLINICAL DATA: PE suspected.  Pregnant.  Worsening cough.

EXAM:
CT ANGIOGRAPHY CHEST WITH CONTRAST
TECHNIQUE: Multidetector CT imaging of the chest was performed using the
standard protocol during bolus administration of intravenous
contrast. Multiplanar CT image reconstructions and MIPs were
obtained to evaluate the vascular anatomy.
CONTRAST:  75mL OMNIPAQUE IOHEXOL 350 MG/ML SOLN

[Series 6: thins · axial · 0.96mm/px · z∈[+1030,+1233]mm · 14 of 223 slices shown]
[im 10/223  lung]
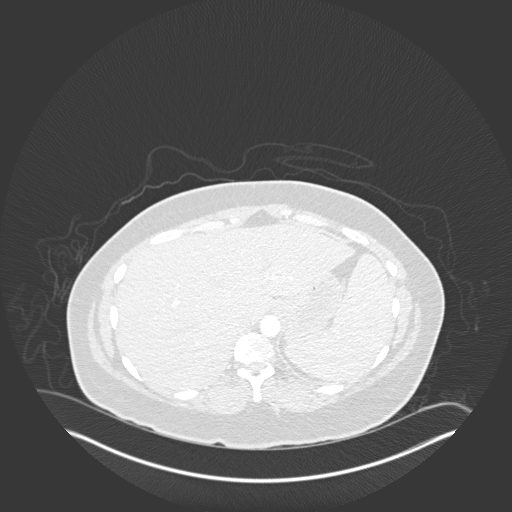
[im 29/223  soft-tissue]
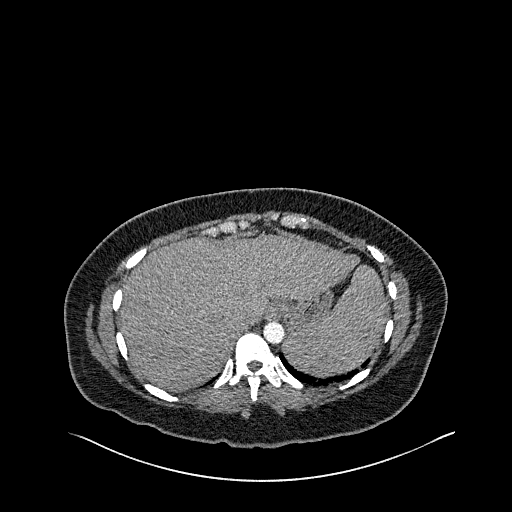
[im 39/223  lung]
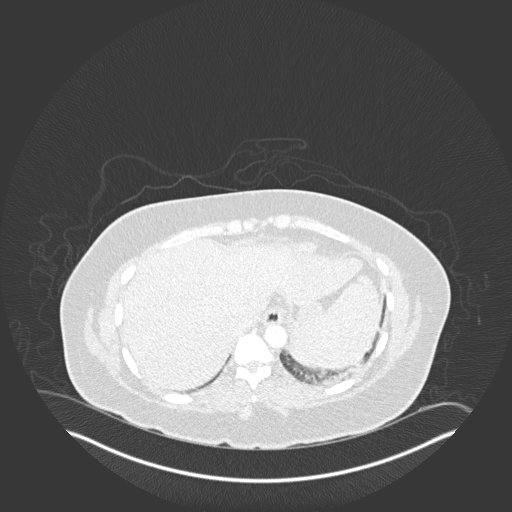
[im 58/223  soft-tissue]
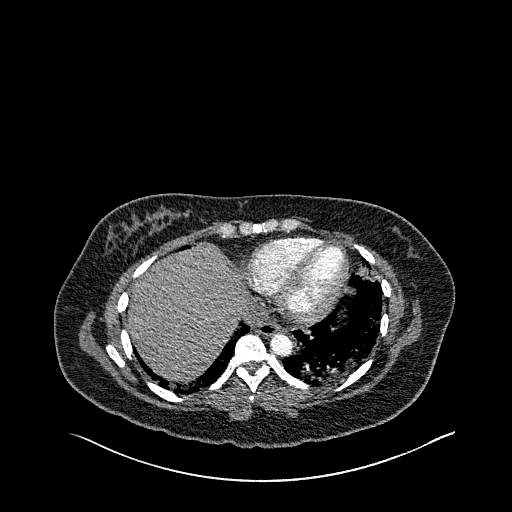
[im 78/223  lung]
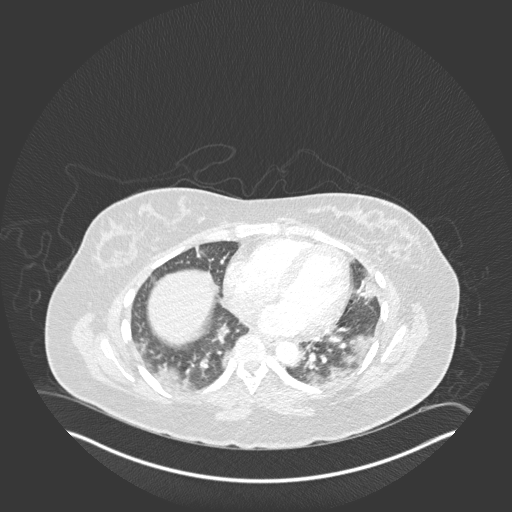
[im 87/223  soft-tissue]
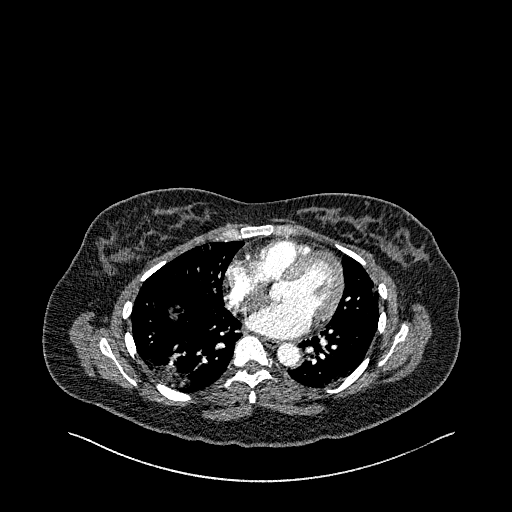
[im 107/223  lung]
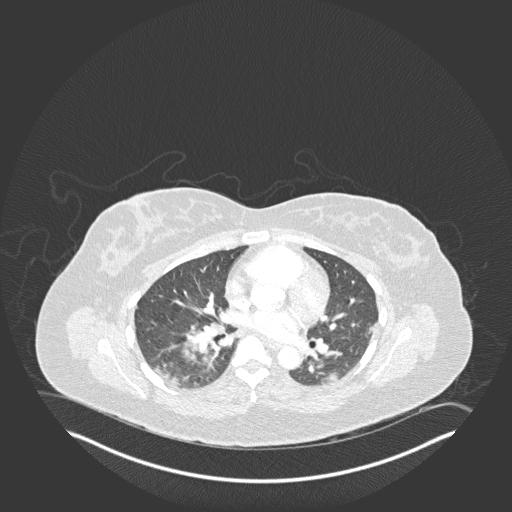
[im 116/223  soft-tissue]
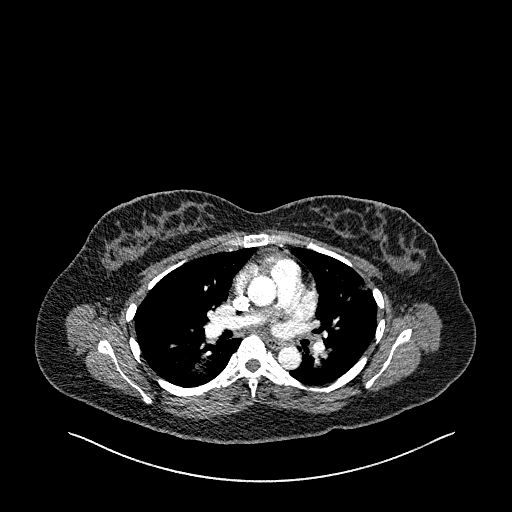
[im 136/223  lung]
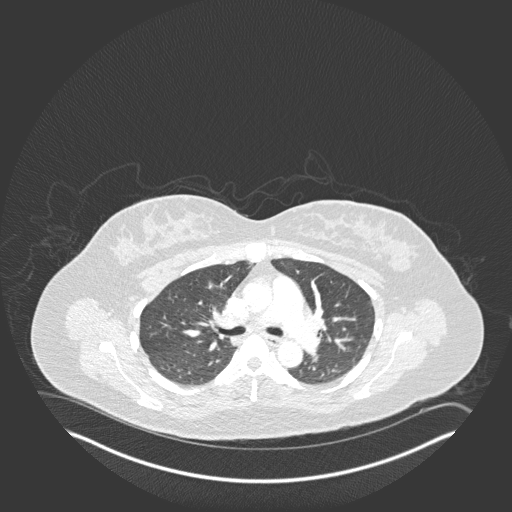
[im 145/223  soft-tissue]
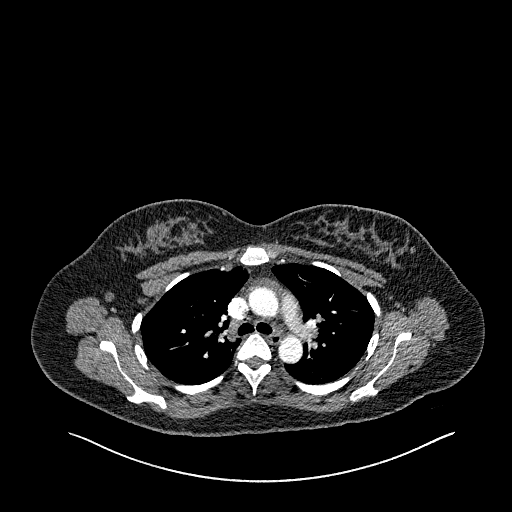
[im 165/223  lung]
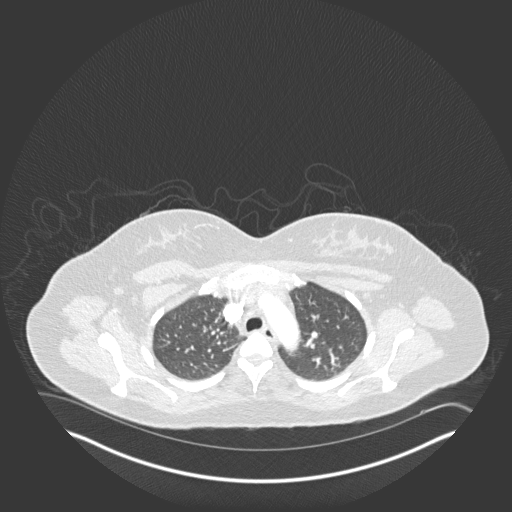
[im 184/223  soft-tissue]
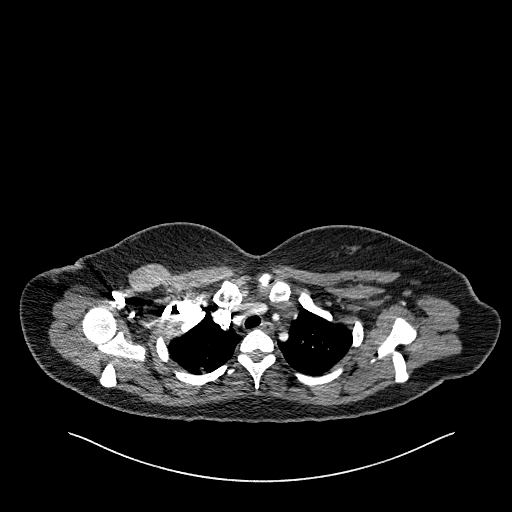
[im 194/223  lung]
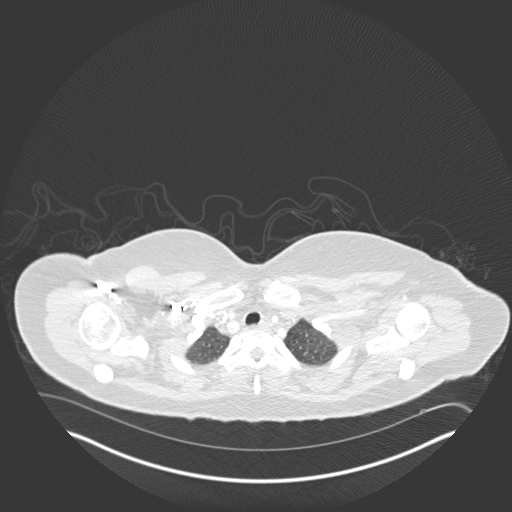
[im 213/223  soft-tissue]
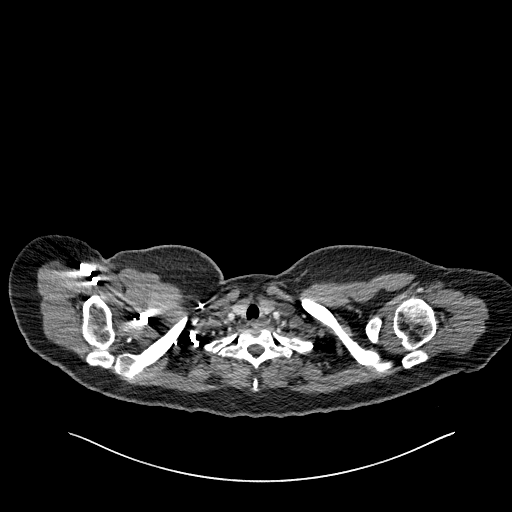

[Series 8: coronal mpr · coronal · 0.44mm/px · 3 of 108 slices shown]
[im 27/108  soft-tissue]
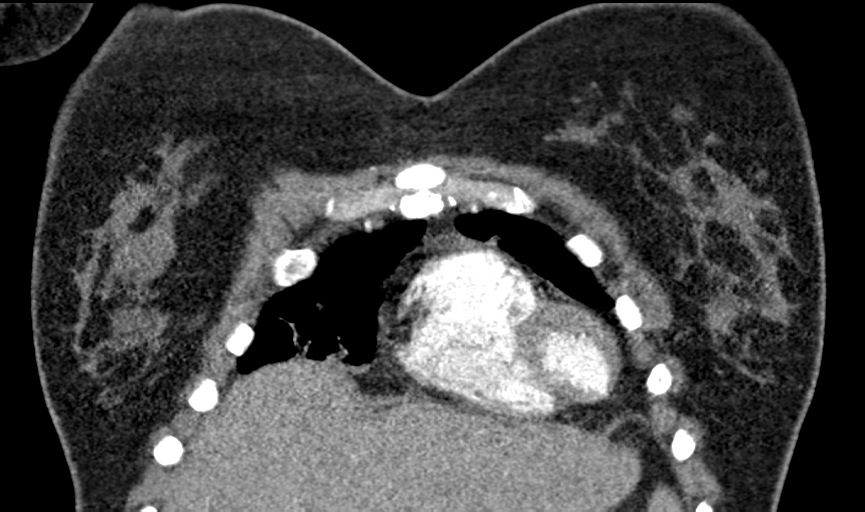
[im 54/108  soft-tissue]
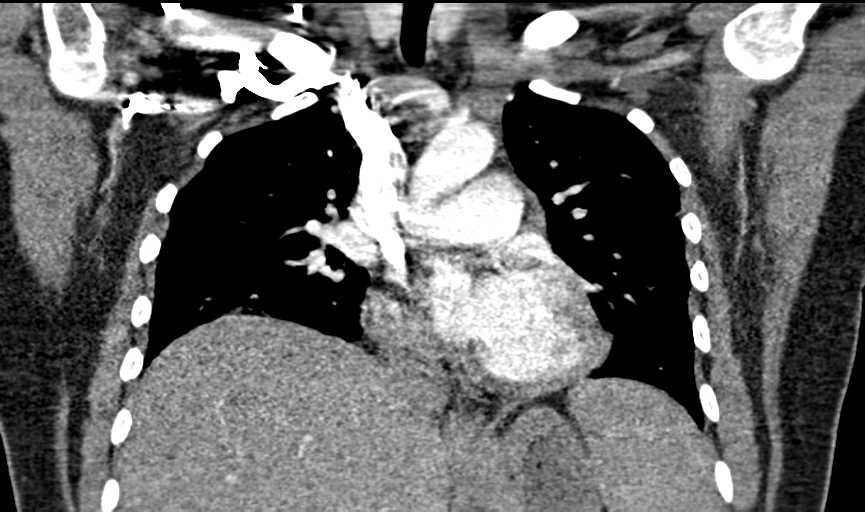
[im 81/108  soft-tissue]
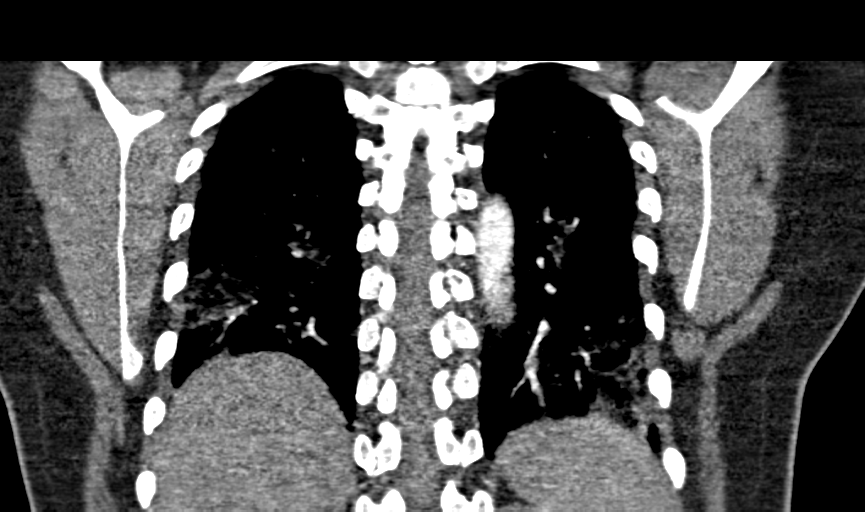

[17 of 46 positions shown; findings below may reference images not displayed]

FINDINGS: Cardiovascular: Evaluation for pulmonary emboli is limited by
suboptimal contrast bolus timing. Within that limitation, there is
no central pulmonary embolus. The size of the main pulmonary artery
is normal. Heart size is normal, with no pericardial effusion. The
course and caliber of the aorta are normal. There is no
atherosclerotic calcification. Opacification decreased due to
pulmonary arterial phase contrast bolus timing.

Mediastinum/Nodes:

-- No mediastinal lymphadenopathy.

-- No hilar lymphadenopathy.

-- No axillary lymphadenopathy.

-- No supraclavicular lymphadenopathy.

-- Normal thyroid gland where visualized.

-  Unremarkable esophagus.

Lungs/Pleura: There are hazy multifocal airspace opacities at the
lung bases bilaterally. There is no pneumothorax. There is no large
pleural effusion.

Upper Abdomen: Contrast bolus timing is not optimized for evaluation
of the abdominal organs. The visualized portions of the organs of
the upper abdomen are normal.

Musculoskeletal: No chest wall abnormality. No bony spinal canal
stenosis. There is a small nodule in the left breast measuring
approximately 5 mm (axial series 5, image 26).

Review of the MIP images confirms the above findings.
IMPRESSION: 1. Evaluation for pulmonary emboli is limited by suboptimal contrast
bolus timing. Within that limitation, there is no central pulmonary
embolus.
2. Hazy multifocal airspace opacities at the lung bases bilaterally,
concerning for multifocal pneumonia (viral or bacterial).
3. Small 5 mm nodule in the left breast. Follow-up with outpatient
mammography is recommended.

## 2021-07-29 DIAGNOSIS — H903 Sensorineural hearing loss, bilateral: Secondary | ICD-10-CM | POA: Diagnosis not present

## 2021-08-05 DIAGNOSIS — L918 Other hypertrophic disorders of the skin: Secondary | ICD-10-CM | POA: Diagnosis not present

## 2021-08-05 DIAGNOSIS — D1801 Hemangioma of skin and subcutaneous tissue: Secondary | ICD-10-CM | POA: Diagnosis not present

## 2021-08-05 DIAGNOSIS — D239 Other benign neoplasm of skin, unspecified: Secondary | ICD-10-CM | POA: Diagnosis not present

## 2021-08-05 DIAGNOSIS — D485 Neoplasm of uncertain behavior of skin: Secondary | ICD-10-CM | POA: Diagnosis not present

## 2021-08-22 DIAGNOSIS — H903 Sensorineural hearing loss, bilateral: Secondary | ICD-10-CM | POA: Diagnosis not present

## 2021-09-06 IMAGING — US US BREAST*L* LIMITED INC AXILLA
1 series · 6 of 6 positions shown · non-contrast
Comparison: None.

CLINICAL DATA: 34-year-old, 29 week pregnant female for evaluation
of a 5 mm mass in the upper outer quadrant of the left breast seen
on recent CT.

EXAM:
ULTRASOUND OF THE LEFT BREAST

[Series 1: us breast*left* limited inc axilla · 0.05mm/px · 6 of 6 slices shown]
[im 1/6]
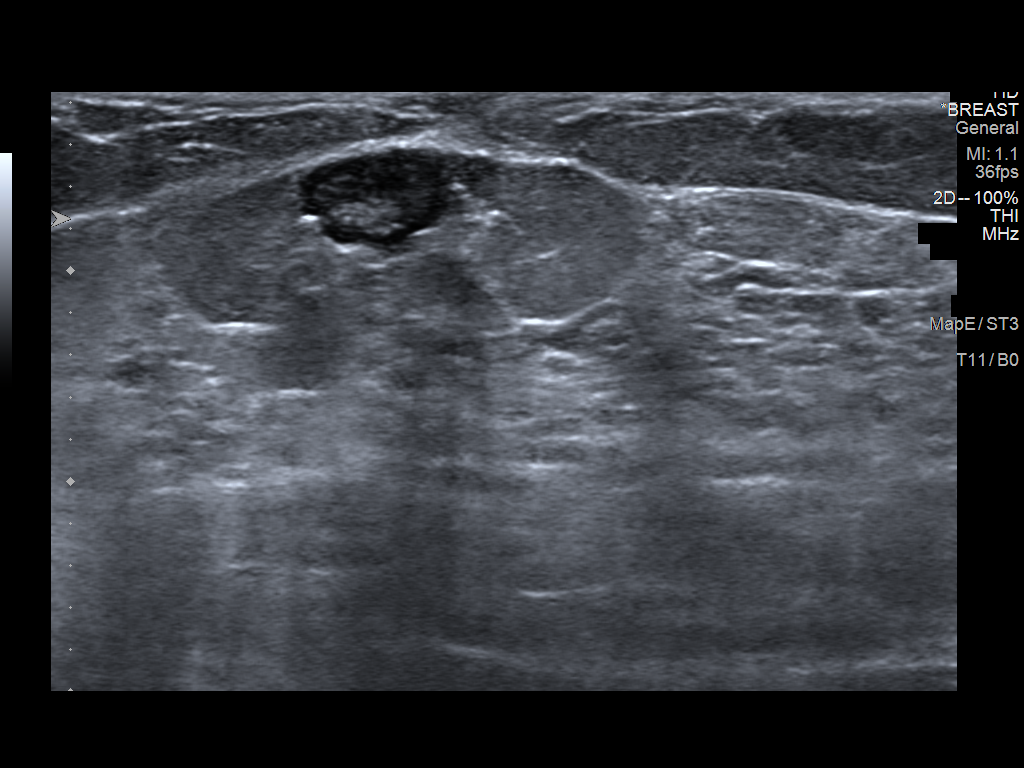
[im 2/6]
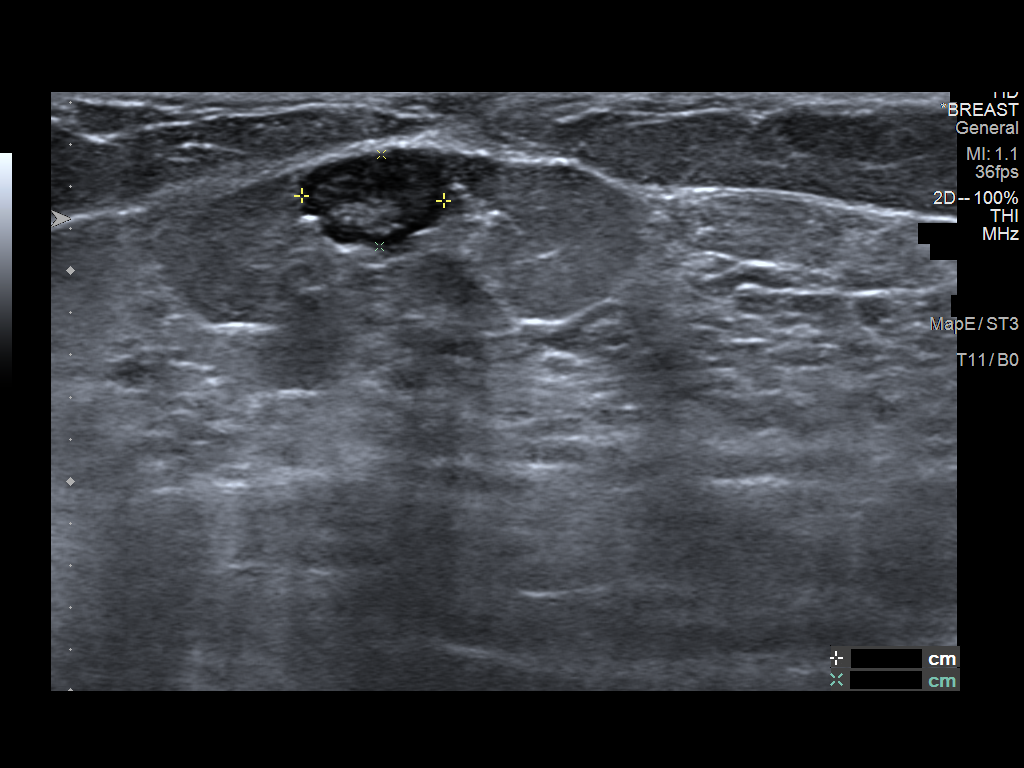
[im 3/6]
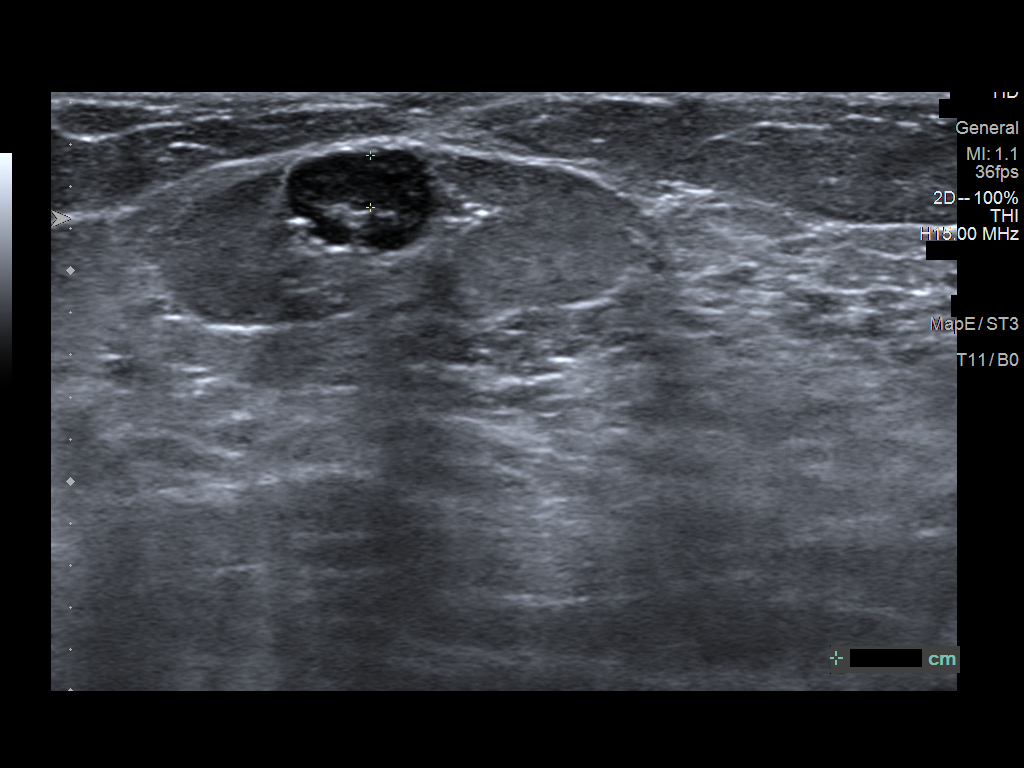
[im 4/6]
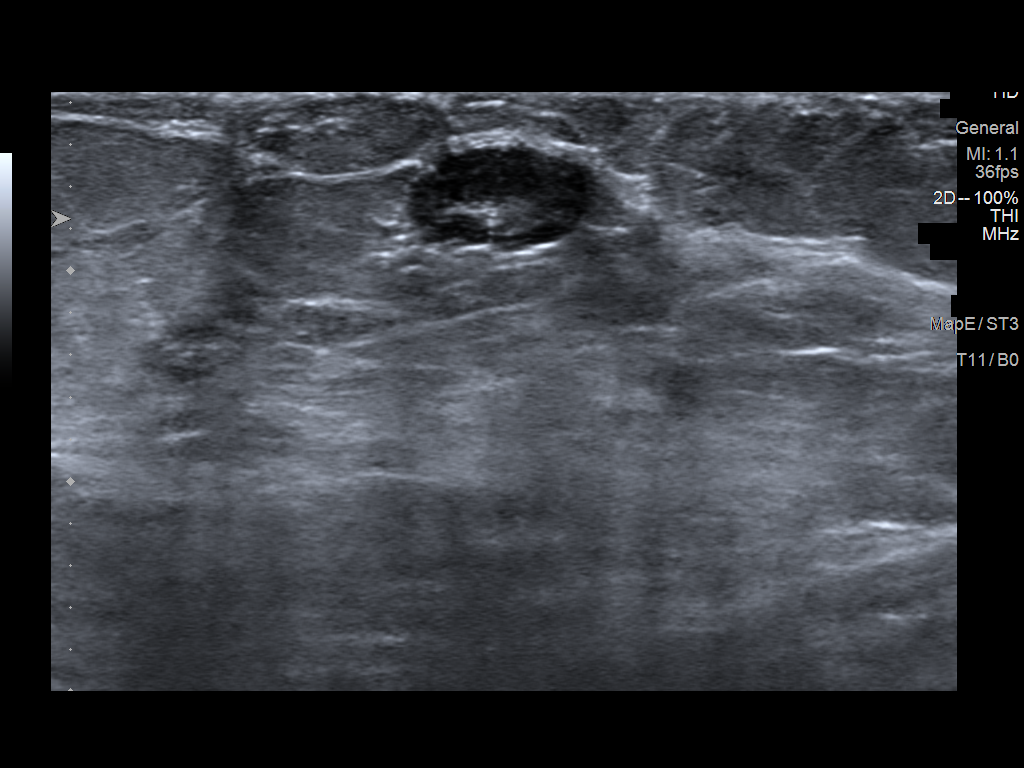
[im 5/6]
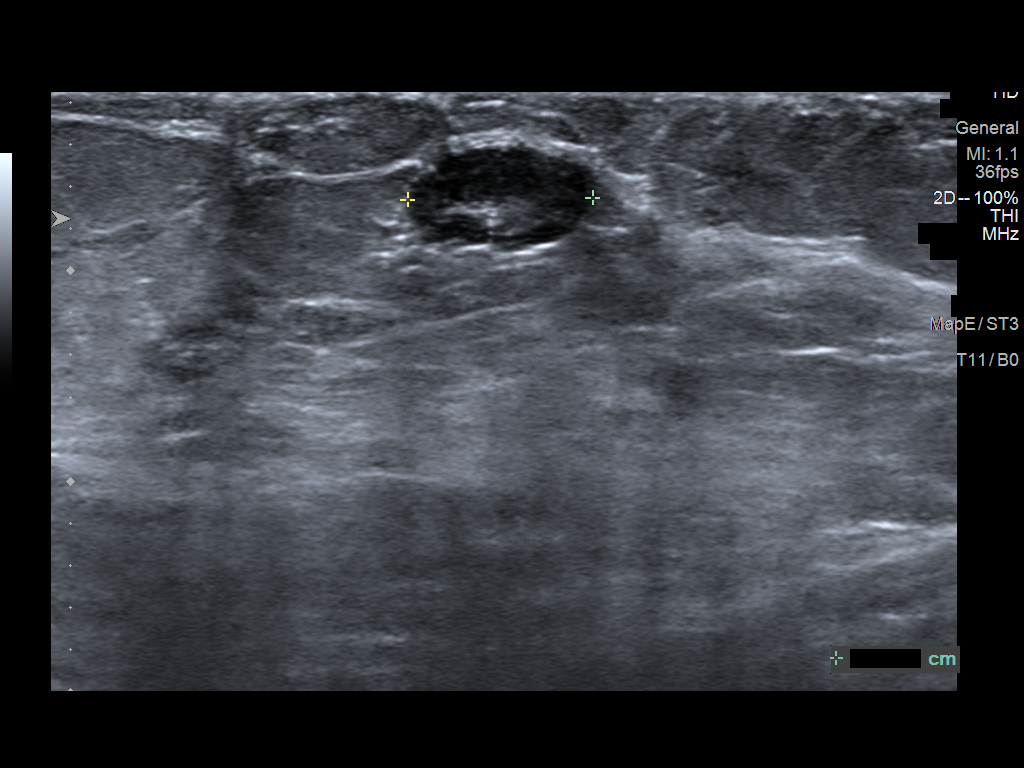
[im 6/6]
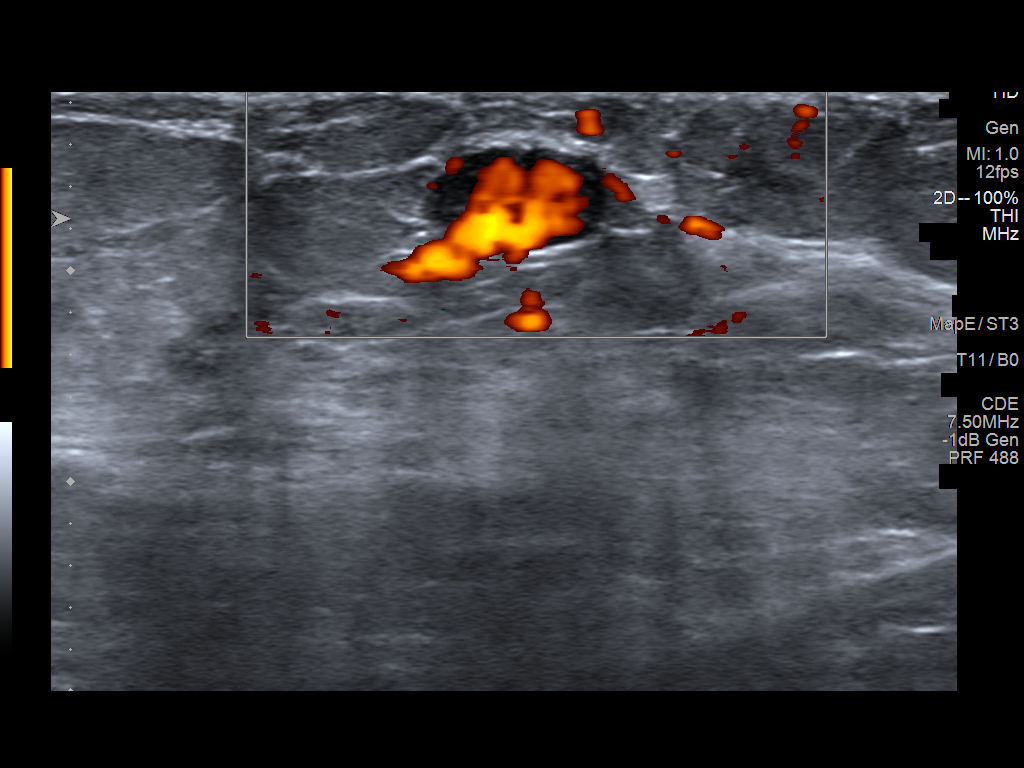

[6 of 6 positions shown; findings below may reference images not displayed]

FINDINGS: Targeted ultrasound is performed, showing a morphologically normal
intramammary lymph node at the 1 o'clock position 6 cm from the
nipple. It measures 6-7 mm with 0.25 mm cortical thickness. This
correlates well with the CT finding.
IMPRESSION: Morphologically normal intramammary lymph node in the upper outer
left breast corresponding with recent CT findings. No further
imaging follow-up required.

RECOMMENDATION:
Screening mammogram at age 40 unless there are persistent or
intervening clinical concerns. (Code:T9-T-NTL)

I have discussed the findings and recommendations with the patient.
If applicable, a reminder letter will be sent to the patient
regarding the next appointment.

BI-RADS CATEGORY  2: Benign.

## 2021-10-20 IMAGING — US US MFM FETAL BPP W/O NON-STRESS
1 series · 15 of 28 positions shown · non-contrast
Comparison: none

[Series 1: us mfm fetal bpp w/o non-stress · 31 acquisitions, 15 frames shown]
[im 1/31]
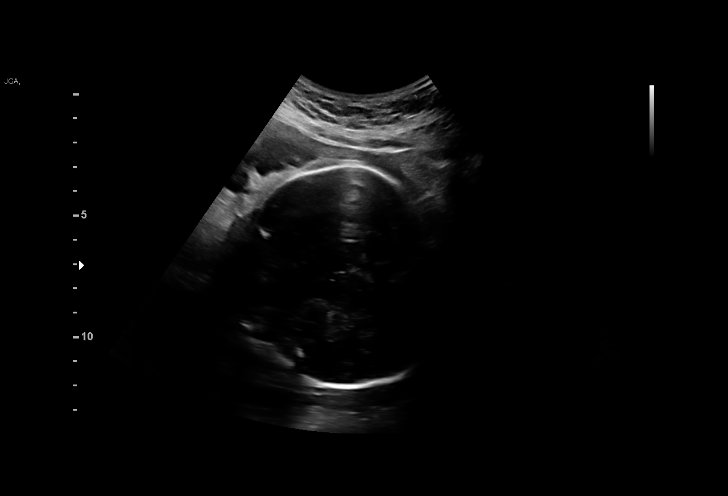
[im 3/31]
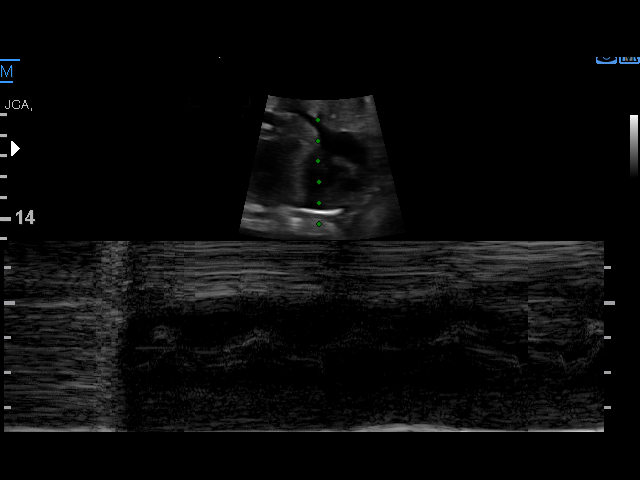
[im 5/31]
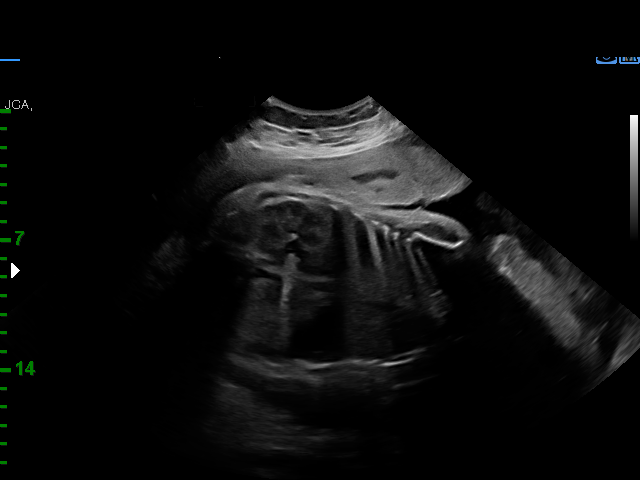
[im 7/31]
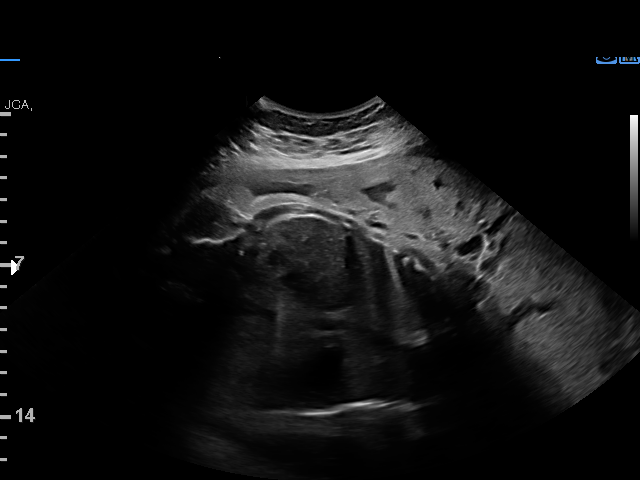
[im 9/31]
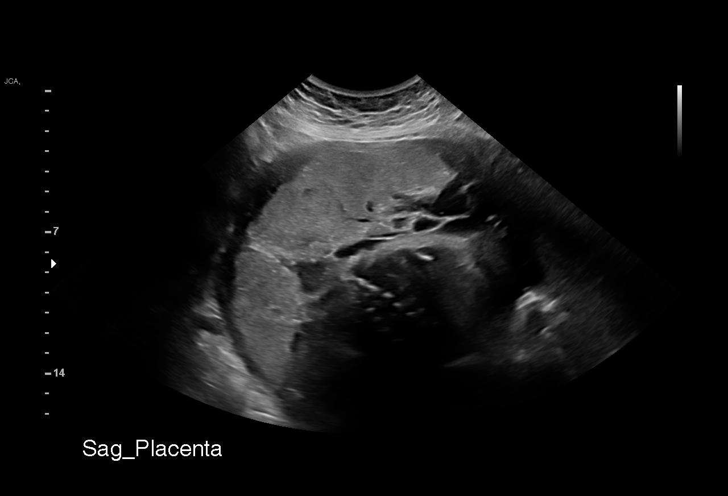
[im 12/31]
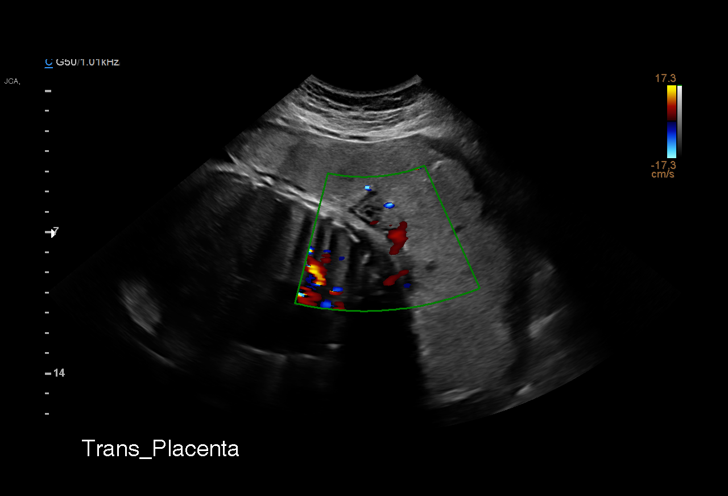
[im 14/31]
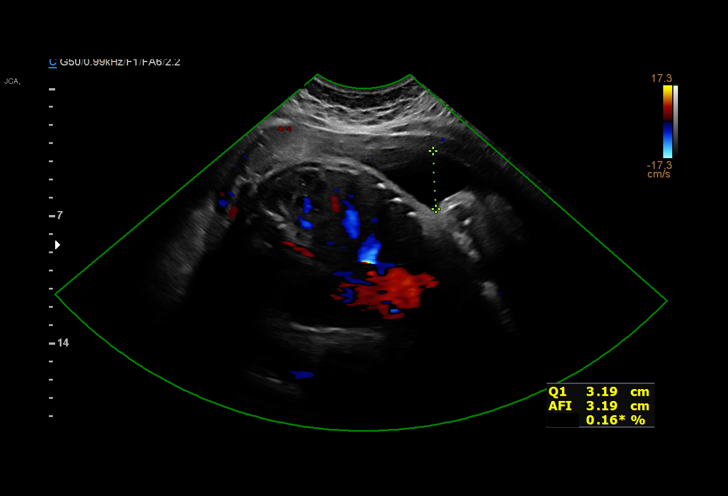
[im 16/31]
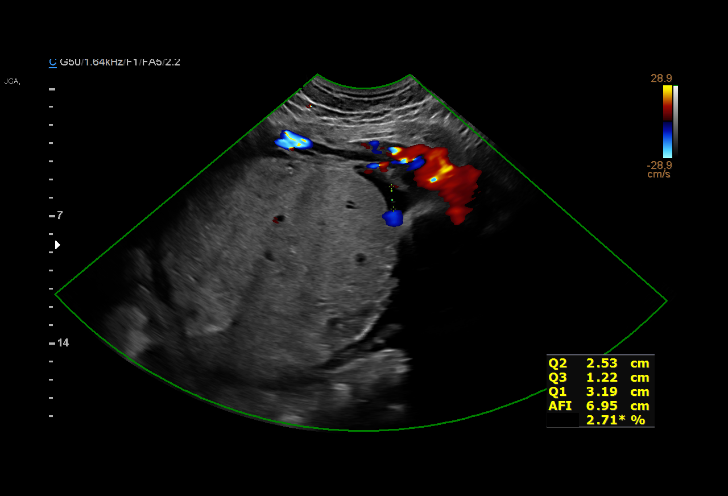
[im 17/31]
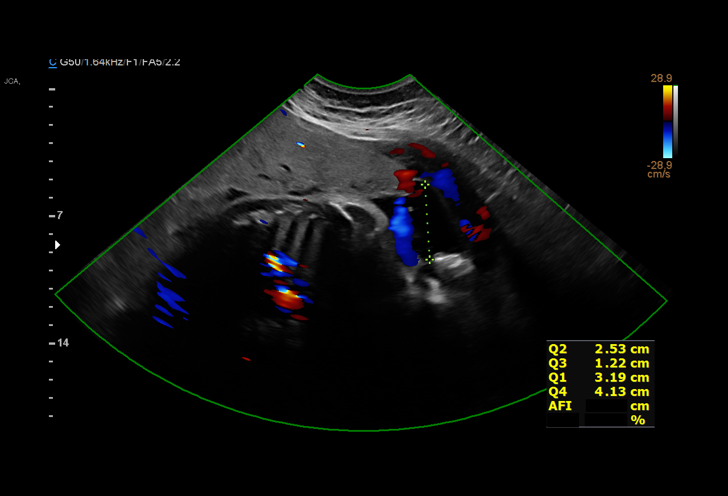
[im 19/31]
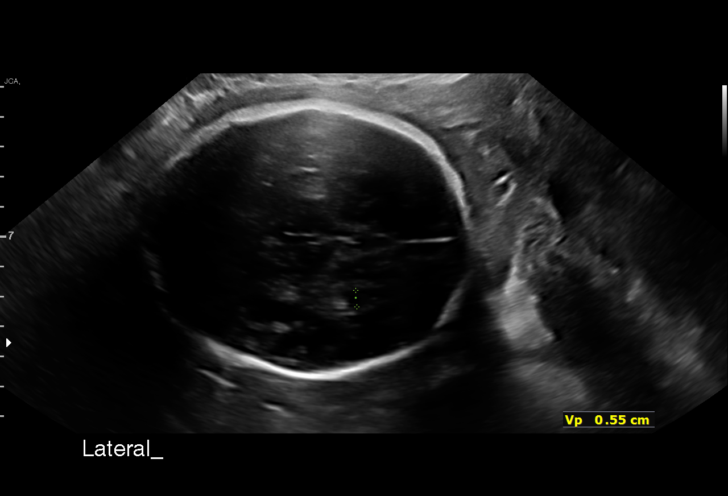
[im 22/31]
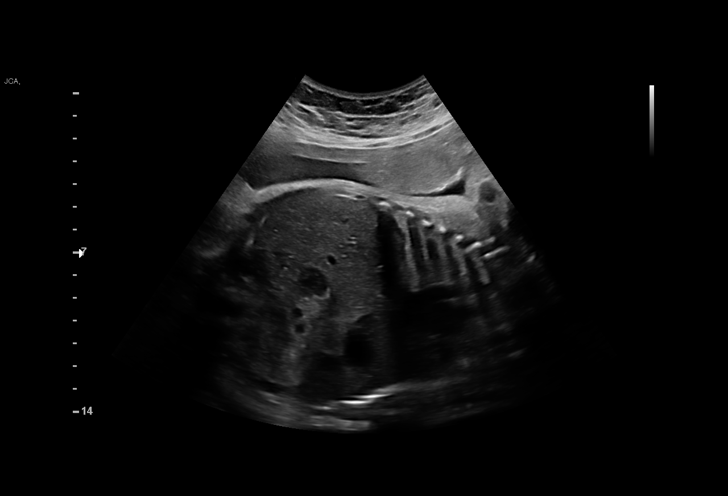
[im 24/31]
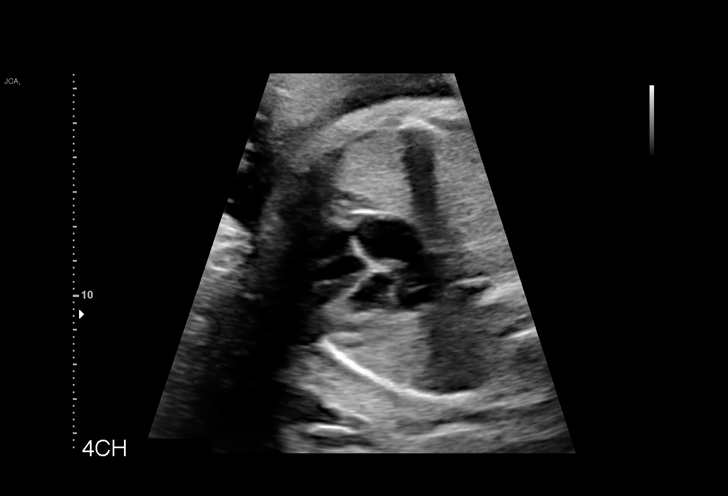
[im 26/31]
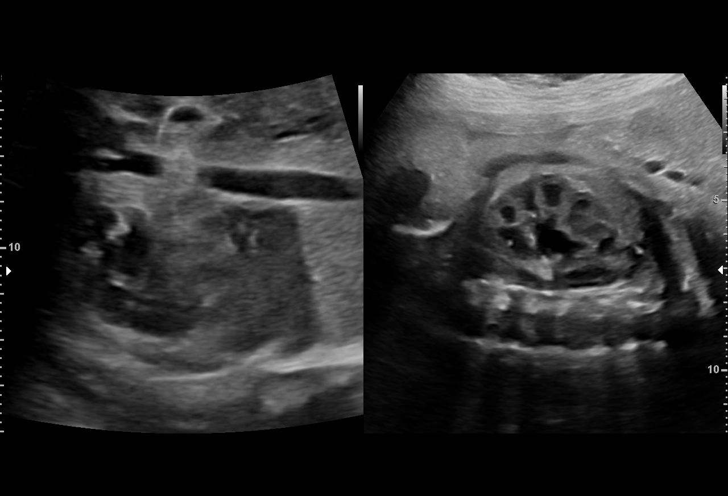
[im 28/31]
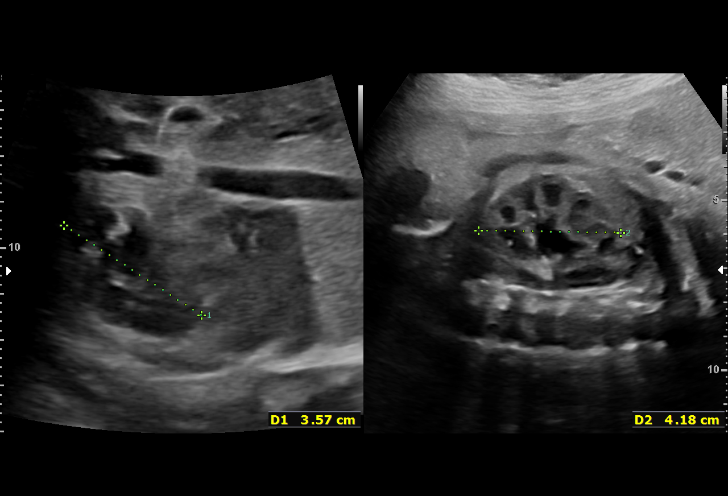
[im 31/31]
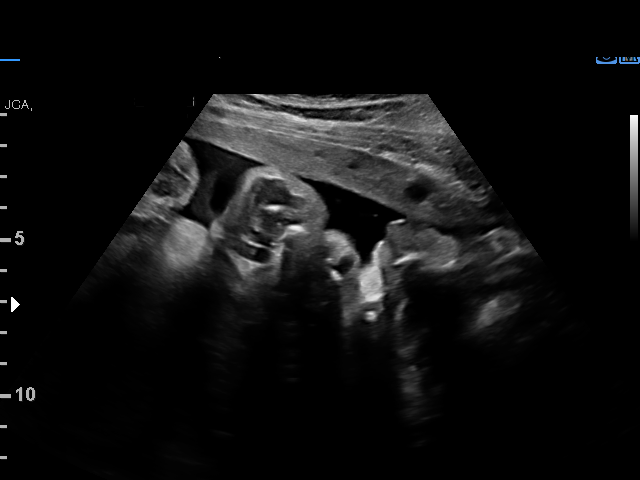

[15 of 28 positions shown; findings below may reference images not displayed]

OBGYN

Indications

 Gestational diabetes in pregnancy,
 controlled by oral hypoglycemic drugs
 (insulin)
 Advanced maternal age multigravida 35+,
 third trimester
 Pregnancy resulting from assisted
 reproductive technology (IVF)
 Poor obstetric history: Previous gestational
 diabetes
 History of cesarean delivery, currently
 pregnant
 Encounter for other antenatal screening
 follow-up
 34 weeks gestation of pregnancy
Fetal Evaluation

 Num Of Fetuses:         1
 Cardiac Activity:       Observed
 Presentation:           Cephalic
 Placenta:               Anterior
 P. Cord Insertion:      Visualized
 Amniotic Fluid
 AFI FV:      Within normal limits
Biophysical Evaluation

 Amniotic F.V:   Pocket => 2 cm             F. Tone:        Observed
 F. Movement:    Observed                   Score:          [DATE]
 F. Breathing:   Observed
OB History

 Gravidity:    5         Term:   1        Prem:   0        SAB:   3
 TOP:          0       Ectopic:  0        Living: 1
Gestational Age

 LMP:           34w 4d        Date:  03/18/20                 EDD:   12/23/20
 Best:          34w 4d     Det. By:  LMP  (03/18/20)          EDD:   12/23/20
Anatomy

 Ventricles:            Appears normal         Stomach:                Appears normal, left
                                                                       sided
 Heart:                 Appears normal         Kidneys:                Appear normal
                        (4CH, axis, and
                        situs)
 Diaphragm:             Appears normal         Bladder:                Appears normal
Impression

 Antenatal testing performed given maternal [id number]
 The biophysical profile was [DATE] with good fetal movement and
 amniotic fluid volume.

 Her blood sugars are reported to be with in normal range in
 most days when not working however, her earlyu [DATE]
 measurements prior to work are elevated.
Recommendations

 Continue weekly testing
 Follow up growth in 3 weeks.
 Delivery scheduled at 39 weeks

## 2021-11-25 DIAGNOSIS — H903 Sensorineural hearing loss, bilateral: Secondary | ICD-10-CM | POA: Diagnosis not present

## 2021-12-19 DIAGNOSIS — H903 Sensorineural hearing loss, bilateral: Secondary | ICD-10-CM | POA: Diagnosis not present

## 2022-03-21 DIAGNOSIS — D649 Anemia, unspecified: Secondary | ICD-10-CM | POA: Diagnosis not present

## 2022-03-21 DIAGNOSIS — R7989 Other specified abnormal findings of blood chemistry: Secondary | ICD-10-CM | POA: Diagnosis not present

## 2022-03-21 DIAGNOSIS — E7800 Pure hypercholesterolemia, unspecified: Secondary | ICD-10-CM | POA: Diagnosis not present

## 2022-03-28 DIAGNOSIS — Z1331 Encounter for screening for depression: Secondary | ICD-10-CM | POA: Diagnosis not present

## 2022-03-28 DIAGNOSIS — Z8616 Personal history of COVID-19: Secondary | ICD-10-CM | POA: Diagnosis not present

## 2022-03-28 DIAGNOSIS — E7800 Pure hypercholesterolemia, unspecified: Secondary | ICD-10-CM | POA: Diagnosis not present

## 2022-03-28 DIAGNOSIS — R82998 Other abnormal findings in urine: Secondary | ICD-10-CM | POA: Diagnosis not present

## 2022-03-28 DIAGNOSIS — H9193 Unspecified hearing loss, bilateral: Secondary | ICD-10-CM | POA: Diagnosis not present

## 2022-03-28 DIAGNOSIS — O24419 Gestational diabetes mellitus in pregnancy, unspecified control: Secondary | ICD-10-CM | POA: Diagnosis not present

## 2022-03-28 DIAGNOSIS — Z0000 Encounter for general adult medical examination without abnormal findings: Secondary | ICD-10-CM | POA: Diagnosis not present

## 2022-03-28 DIAGNOSIS — Z1339 Encounter for screening examination for other mental health and behavioral disorders: Secondary | ICD-10-CM | POA: Diagnosis not present

## 2022-03-28 DIAGNOSIS — E663 Overweight: Secondary | ICD-10-CM | POA: Diagnosis not present

## 2022-05-23 ENCOUNTER — Other Ambulatory Visit (HOSPITAL_COMMUNITY): Payer: Self-pay

## 2022-05-23 ENCOUNTER — Other Ambulatory Visit: Payer: Self-pay | Admitting: Student

## 2022-05-23 MED ORDER — NITROFURANTOIN MONOHYD MACRO 100 MG PO CAPS
100.0000 mg | ORAL_CAPSULE | Freq: Two times a day (BID) | ORAL | 0 refills | Status: AC
  Filled 2022-05-23: qty 10, 5d supply, fill #0

## 2022-07-16 DIAGNOSIS — Z124 Encounter for screening for malignant neoplasm of cervix: Secondary | ICD-10-CM | POA: Diagnosis not present

## 2022-07-16 DIAGNOSIS — Z01419 Encounter for gynecological examination (general) (routine) without abnormal findings: Secondary | ICD-10-CM | POA: Diagnosis not present

## 2022-07-18 DIAGNOSIS — H5213 Myopia, bilateral: Secondary | ICD-10-CM | POA: Diagnosis not present

## 2023-04-17 DIAGNOSIS — E7800 Pure hypercholesterolemia, unspecified: Secondary | ICD-10-CM | POA: Diagnosis not present

## 2023-04-17 DIAGNOSIS — D649 Anemia, unspecified: Secondary | ICD-10-CM | POA: Diagnosis not present

## 2023-04-17 DIAGNOSIS — R7989 Other specified abnormal findings of blood chemistry: Secondary | ICD-10-CM | POA: Diagnosis not present

## 2023-04-24 DIAGNOSIS — Z0000 Encounter for general adult medical examination without abnormal findings: Secondary | ICD-10-CM | POA: Diagnosis not present

## 2023-04-24 DIAGNOSIS — R82998 Other abnormal findings in urine: Secondary | ICD-10-CM | POA: Diagnosis not present

## 2023-04-24 DIAGNOSIS — H9193 Unspecified hearing loss, bilateral: Secondary | ICD-10-CM | POA: Diagnosis not present

## 2023-04-24 DIAGNOSIS — Z8632 Personal history of gestational diabetes: Secondary | ICD-10-CM | POA: Diagnosis not present

## 2023-04-24 DIAGNOSIS — Z1339 Encounter for screening examination for other mental health and behavioral disorders: Secondary | ICD-10-CM | POA: Diagnosis not present

## 2023-04-24 DIAGNOSIS — E663 Overweight: Secondary | ICD-10-CM | POA: Diagnosis not present

## 2023-04-24 DIAGNOSIS — Z1331 Encounter for screening for depression: Secondary | ICD-10-CM | POA: Diagnosis not present

## 2023-04-24 DIAGNOSIS — E7800 Pure hypercholesterolemia, unspecified: Secondary | ICD-10-CM | POA: Diagnosis not present

## 2023-09-14 DIAGNOSIS — Z3169 Encounter for other general counseling and advice on procreation: Secondary | ICD-10-CM | POA: Diagnosis not present

## 2023-10-13 DIAGNOSIS — Z319 Encounter for procreative management, unspecified: Secondary | ICD-10-CM | POA: Diagnosis not present

## 2023-10-16 ENCOUNTER — Other Ambulatory Visit (HOSPITAL_COMMUNITY): Payer: Self-pay

## 2023-10-16 DIAGNOSIS — N858 Other specified noninflammatory disorders of uterus: Secondary | ICD-10-CM | POA: Diagnosis not present

## 2023-10-16 MED ORDER — ESTRADIOL 0.1 MG/24HR TD PTTW
MEDICATED_PATCH | TRANSDERMAL | 3 refills | Status: AC
  Filled 2023-10-16: qty 8, 28d supply, fill #0
  Filled 2023-11-30: qty 8, 28d supply, fill #1

## 2023-10-16 MED ORDER — DOXYCYCLINE HYCLATE 100 MG PO TABS
100.0000 mg | ORAL_TABLET | Freq: Two times a day (BID) | ORAL | 0 refills | Status: AC
  Filled 2023-10-16: qty 10, 5d supply, fill #0

## 2023-10-16 MED ORDER — SHARPS CONTAINER MISC: 2 refills | Status: AC

## 2023-10-16 MED ORDER — METHYLPREDNISOLONE 8 MG PO TABS
8.0000 mg | ORAL_TABLET | Freq: Two times a day (BID) | ORAL | 2 refills | Status: AC
  Filled 2023-10-16 – 2023-10-19 (×2): qty 8, 4d supply, fill #0

## 2023-10-16 MED ORDER — ALCOHOL SWABS PADS: MEDICATED_PAD | 3 refills | Status: AC

## 2023-10-16 MED ORDER — "SYRINGE/NEEDLE (DISP) 18G X 1-1/2"" 3 ML MISC"
3 refills | Status: AC
  Filled 2023-10-16: qty 30, 30d supply, fill #0

## 2023-10-16 MED ORDER — "HYPODERMIC NEEDLE 22G X 1-1/2"" MISC"
3 refills | Status: AC
  Filled 2023-10-16: qty 30, 30d supply, fill #0

## 2023-10-16 MED ORDER — PROGESTERONE 50 MG/ML IM OIL
50.0000 mg | TOPICAL_OIL | Freq: Every day | INTRAMUSCULAR | 3 refills | Status: AC
  Filled 2023-10-16 – 2023-10-19 (×2): qty 30, 30d supply, fill #0

## 2023-10-16 MED ORDER — ESTRADIOL 2 MG PO TABS
2.0000 mg | ORAL_TABLET | Freq: Two times a day (BID) | ORAL | 3 refills | Status: AC
  Filled 2023-10-16: qty 60, 30d supply, fill #0
  Filled 2023-12-09: qty 60, 30d supply, fill #1

## 2023-10-19 ENCOUNTER — Other Ambulatory Visit: Payer: Self-pay

## 2023-10-19 ENCOUNTER — Other Ambulatory Visit (HOSPITAL_COMMUNITY): Payer: Self-pay

## 2023-10-19 DIAGNOSIS — E559 Vitamin D deficiency, unspecified: Secondary | ICD-10-CM | POA: Diagnosis not present

## 2023-10-27 ENCOUNTER — Other Ambulatory Visit (HOSPITAL_COMMUNITY): Payer: Self-pay

## 2023-10-27 MED ORDER — VITAMIN D3 1.25 MG (50000 UT) PO CAPS
1.0000 | ORAL_CAPSULE | ORAL | 6 refills | Status: AC
  Filled 2023-10-27: qty 4, 28d supply, fill #0

## 2023-11-24 DIAGNOSIS — Z3183 Encounter for assisted reproductive fertility procedure cycle: Secondary | ICD-10-CM | POA: Diagnosis not present

## 2023-12-10 DIAGNOSIS — Z3200 Encounter for pregnancy test, result unknown: Secondary | ICD-10-CM | POA: Diagnosis not present

## 2023-12-22 ENCOUNTER — Other Ambulatory Visit (HOSPITAL_COMMUNITY): Payer: Self-pay

## 2024-04-22 DIAGNOSIS — Z01419 Encounter for gynecological examination (general) (routine) without abnormal findings: Secondary | ICD-10-CM | POA: Diagnosis not present

## 2024-04-22 DIAGNOSIS — E7800 Pure hypercholesterolemia, unspecified: Secondary | ICD-10-CM | POA: Diagnosis not present

## 2024-04-22 DIAGNOSIS — D649 Anemia, unspecified: Secondary | ICD-10-CM | POA: Diagnosis not present

## 2024-04-22 DIAGNOSIS — Z1212 Encounter for screening for malignant neoplasm of rectum: Secondary | ICD-10-CM | POA: Diagnosis not present

## 2024-04-22 DIAGNOSIS — Z1231 Encounter for screening mammogram for malignant neoplasm of breast: Secondary | ICD-10-CM | POA: Diagnosis not present

## 2024-04-25 DIAGNOSIS — M7702 Medial epicondylitis, left elbow: Secondary | ICD-10-CM | POA: Diagnosis not present

## 2024-04-25 DIAGNOSIS — R82998 Other abnormal findings in urine: Secondary | ICD-10-CM | POA: Diagnosis not present

## 2024-04-25 DIAGNOSIS — Z0000 Encounter for general adult medical examination without abnormal findings: Secondary | ICD-10-CM | POA: Diagnosis not present

## 2024-04-25 DIAGNOSIS — H9193 Unspecified hearing loss, bilateral: Secondary | ICD-10-CM | POA: Diagnosis not present

## 2024-04-25 DIAGNOSIS — E663 Overweight: Secondary | ICD-10-CM | POA: Diagnosis not present

## 2024-04-25 DIAGNOSIS — Z1331 Encounter for screening for depression: Secondary | ICD-10-CM | POA: Diagnosis not present

## 2024-04-25 DIAGNOSIS — E7800 Pure hypercholesterolemia, unspecified: Secondary | ICD-10-CM | POA: Diagnosis not present

## 2024-04-25 DIAGNOSIS — Z1339 Encounter for screening examination for other mental health and behavioral disorders: Secondary | ICD-10-CM | POA: Diagnosis not present

## 2024-04-26 ENCOUNTER — Other Ambulatory Visit (HOSPITAL_BASED_OUTPATIENT_CLINIC_OR_DEPARTMENT_OTHER): Payer: Self-pay | Admitting: Internal Medicine

## 2024-04-26 DIAGNOSIS — E7800 Pure hypercholesterolemia, unspecified: Secondary | ICD-10-CM

## 2024-05-20 ENCOUNTER — Ambulatory Visit (HOSPITAL_BASED_OUTPATIENT_CLINIC_OR_DEPARTMENT_OTHER): Payer: Self-pay

## 2024-05-20 ENCOUNTER — Encounter (HOSPITAL_BASED_OUTPATIENT_CLINIC_OR_DEPARTMENT_OTHER): Payer: Self-pay

## 2024-06-09 ENCOUNTER — Other Ambulatory Visit (HOSPITAL_BASED_OUTPATIENT_CLINIC_OR_DEPARTMENT_OTHER)

## 2024-06-15 ENCOUNTER — Other Ambulatory Visit

## 2024-06-27 ENCOUNTER — Ambulatory Visit (HOSPITAL_COMMUNITY)
Admission: RE | Admit: 2024-06-27 | Discharge: 2024-06-27 | Disposition: A | Discharge status: Home / Self Care | Payer: Self-pay | Source: Ambulatory Visit | Attending: Internal Medicine | Admitting: Internal Medicine

## 2024-06-27 DIAGNOSIS — E7800 Pure hypercholesterolemia, unspecified: Secondary | ICD-10-CM | POA: Insufficient documentation

## 2024-08-16 DIAGNOSIS — H903 Sensorineural hearing loss, bilateral: Secondary | ICD-10-CM | POA: Diagnosis not present
